# Patient Record
Sex: Female | Born: 1960 | Race: White | Hispanic: No
Health system: Midwestern US, Community
[De-identification: ages and names within clinical notes are randomized; demographics above are authoritative.]

## PROBLEM LIST (undated history)

## (undated) DIAGNOSIS — D6859 Other primary thrombophilia: Secondary | ICD-10-CM

## (undated) DIAGNOSIS — Z86718 Personal history of other venous thrombosis and embolism: Secondary | ICD-10-CM

## (undated) DIAGNOSIS — I519 Heart disease, unspecified: Secondary | ICD-10-CM

## (undated) DIAGNOSIS — I10 Essential (primary) hypertension: Secondary | ICD-10-CM

## (undated) DIAGNOSIS — I219 Acute myocardial infarction, unspecified: Secondary | ICD-10-CM

## (undated) DIAGNOSIS — E7800 Pure hypercholesterolemia, unspecified: Secondary | ICD-10-CM

## (undated) DIAGNOSIS — I639 Cerebral infarction, unspecified: Secondary | ICD-10-CM

## (undated) DIAGNOSIS — N289 Disorder of kidney and ureter, unspecified: Secondary | ICD-10-CM

## (undated) HISTORY — PX: LIVER BIOPSY: SHX301

## (undated) HISTORY — PX: APPENDECTOMY: SHX54

## (undated) HISTORY — DX: Disorder of kidney and ureter, unspecified: N28.9

## (undated) HISTORY — DX: Pure hypercholesterolemia, unspecified: E78.00

## (undated) HISTORY — DX: Acute myocardial infarction, unspecified: I21.9

---

## 2008-09-20 LAB — LIPID PANEL
CHOL/HDL Ratio: 5.6 — ABNORMAL HIGH (ref 0–5.0)
Cholesterol, total: 192 MG/DL (ref 0–200)
HDL Cholesterol: 34 MG/DL — ABNORMAL LOW (ref 40–60)
LDL, calculated: 131.4 MG/DL — ABNORMAL HIGH (ref 0–100)
LDL/HDL Ratio: 3.9
Triglyceride: 133 MG/DL (ref 0–150)
VLDL, calculated: 26.6 MG/DL

## 2008-09-20 LAB — METABOLIC PANEL, COMPREHENSIVE
A-G Ratio: 1.3 (ref 0.8–1.7)
ALT (SGPT): 31 U/L (ref 30–65)
AST (SGOT): 19 U/L (ref 15–37)
Albumin: 4 g/dL (ref 3.4–5.0)
Alk. phosphatase: 137 U/L — ABNORMAL HIGH (ref 50–136)
Anion gap: 9 mmol/L (ref 5–15)
BUN/Creatinine ratio: 7 — ABNORMAL LOW (ref 12–20)
BUN: 9 MG/DL (ref 7–18)
Bilirubin, total: 0.4 MG/DL (ref 0.1–0.9)
CO2: 30 MMOL/L (ref 21–32)
Calcium: 9 MG/DL (ref 8.4–10.4)
Chloride: 100 MMOL/L (ref 100–108)
Creatinine: 1.3 MG/DL (ref 0.6–1.3)
GFR est AA: 56 mL/min/{1.73_m2} — ABNORMAL LOW (ref >60)
GFR est non-AA: 46 mL/min/{1.73_m2} — ABNORMAL LOW (ref >60)
Globulin: 3.2 g/dL (ref 2.0–4.0)
Glucose: 78 MG/DL (ref 74–99)
Potassium: 4 MMOL/L (ref 3.5–5.5)
Protein, total: 7.2 g/dL (ref 6.4–8.2)
Sodium: 139 MMOL/L (ref 136–145)

## 2008-09-20 LAB — CBC WITH AUTOMATED DIFF
ABS. EOSINOPHILS: 0.1 10*3/uL (ref 0.0–0.4)
ABS. LYMPHOCYTES: 0.6 10*3/uL — ABNORMAL LOW (ref 0.8–3.5)
ABS. MONOCYTES: 0.5 10*3/uL (ref 0–1.0)
ABS. NEUTROPHILS: 4.6 10*3/uL (ref 1.8–8.0)
BASOPHILS: 1 % (ref 0–3)
EOSINOPHILS: 2 % (ref 0–5)
HCT: 45.6 % (ref 36.0–46.0)
HGB: 15 g/dL (ref 12.0–16.0)
LYMPHOCYTES: 10 % — ABNORMAL LOW (ref 20–51)
MCH: 31.8 PG (ref 25.0–35.0)
MCHC: 32.9 g/dL (ref 31.0–37.0)
MCV: 96.4 FL (ref 78.0–102.0)
MONOCYTES: 8 % (ref 2–9)
MPV: 7.2 FL — ABNORMAL LOW (ref 7.4–10.4)
NEUTROPHILS: 79 % — ABNORMAL HIGH (ref 42–75)
PLATELET: 337 10*3/uL (ref 130–400)
RBC: 4.73 M/uL (ref 4.10–5.10)
RDW: 17.2 % — ABNORMAL HIGH (ref 11.5–14.5)
WBC: 5.9 10*3/uL (ref 4.5–13.0)

## 2008-09-20 LAB — PROTHROMBIN TIME + INR
INR: 3.4 — ABNORMAL HIGH (ref 0.0–1.2)
Prothrombin time: 36.1 SECS — ABNORMAL HIGH (ref 11.5–15.2)

## 2008-10-25 LAB — METABOLIC PANEL, COMPREHENSIVE
A-G Ratio: 1.2 (ref 0.8–1.7)
ALT (SGPT): 34 U/L (ref 30–65)
AST (SGOT): 21 U/L (ref 15–37)
Albumin: 4 g/dL (ref 3.4–5.0)
Alk. phosphatase: 142 U/L — ABNORMAL HIGH (ref 50–136)
Anion gap: 7 mmol/L (ref 5–15)
BUN/Creatinine ratio: 6 — ABNORMAL LOW (ref 12–20)
BUN: 7 MG/DL (ref 7–18)
Bilirubin, total: 0.7 MG/DL (ref 0.1–0.9)
CO2: 30 MMOL/L (ref 21–32)
Calcium: 9 MG/DL (ref 8.4–10.4)
Chloride: 100 MMOL/L (ref 100–108)
Creatinine: 1.2 MG/DL (ref 0.6–1.3)
GFR est AA: >60 mL/min/{1.73_m2} (ref >60)
GFR est non-AA: 51 mL/min/{1.73_m2} — ABNORMAL LOW (ref >60)
Globulin: 3.3 g/dL (ref 2.0–4.0)
Glucose: 86 MG/DL (ref 74–99)
Potassium: 4.4 MMOL/L (ref 3.5–5.5)
Protein, total: 7.3 g/dL (ref 6.4–8.2)
Sodium: 137 MMOL/L (ref 136–145)

## 2008-10-25 LAB — LIPID PANEL
CHOL/HDL Ratio: 5.8 — ABNORMAL HIGH (ref 0–5.0)
Cholesterol, total: 196 MG/DL (ref 0–200)
HDL Cholesterol: 34 MG/DL — ABNORMAL LOW (ref 40–60)
LDL, calculated: 131.6 MG/DL — ABNORMAL HIGH (ref 0–100)
LDL/HDL Ratio: 3.9
Triglyceride: 152 MG/DL — ABNORMAL HIGH (ref 0–150)
VLDL, calculated: 30.4 MG/DL

## 2008-10-25 LAB — CBC WITH AUTOMATED DIFF
ABS. EOSINOPHILS: 0.2 10*3/uL (ref 0.0–0.4)
ABS. LYMPHOCYTES: 1.6 10*3/uL (ref 0.8–3.5)
ABS. MONOCYTES: 0.4 10*3/uL (ref 0–1.0)
ABS. NEUTROPHILS: 7.3 10*3/uL (ref 1.8–8.0)
BASOPHILS: 0 % (ref 0–3)
EOSINOPHILS: 2 % (ref 0–5)
HCT: 43.4 % (ref 36.0–46.0)
HGB: 14.9 g/dL (ref 12.0–16.0)
LYMPHOCYTES: 16 % — ABNORMAL LOW (ref 20–51)
MCH: 32.7 PG (ref 25.0–35.0)
MCHC: 34.4 g/dL (ref 31.0–37.0)
MCV: 95.2 FL (ref 78.0–102.0)
MONOCYTES: 5 % (ref 2–9)
MPV: 7.6 FL (ref 7.4–10.4)
NEUTROPHILS: 77 % — ABNORMAL HIGH (ref 42–75)
PLATELET: 371 10*3/uL (ref 130–400)
RBC: 4.56 M/uL (ref 4.10–5.10)
RDW: 16.5 % — ABNORMAL HIGH (ref 11.5–14.5)
WBC: 9.6 10*3/uL (ref 4.5–13.0)

## 2008-10-25 LAB — PROTHROMBIN TIME + INR
INR: 2.2 — ABNORMAL HIGH (ref 0.0–1.2)
Prothrombin time: 24.8 SECS — ABNORMAL HIGH (ref 11.5–15.2)

## 2008-12-07 LAB — CBC WITH AUTOMATED DIFF
ABS. LYMPHOCYTES: 1.3 10*3/uL (ref 0.8–3.5)
ABS. MONOCYTES: 0.4 10*3/uL (ref 0–1.0)
ABS. NEUTROPHILS: 6.2 10*3/uL (ref 1.8–8.0)
BASOPHILS: 1 % (ref 0–3)
EOSINOPHILS: 2 % (ref 0–5)
HCT: 46.2 % — ABNORMAL HIGH (ref 36.0–46.0)
HGB: 15.1 g/dL (ref 12.0–16.0)
LYMPHOCYTES: 16 % — ABNORMAL LOW (ref 20–51)
MCH: 32 PG (ref 25.0–35.0)
MCHC: 32.6 g/dL (ref 31.0–37.0)
MCV: 98 FL (ref 78.0–102.0)
MONOCYTES: 5 % (ref 2–9)
MPV: 7.3 FL — ABNORMAL LOW (ref 7.4–10.4)
NEUTROPHILS: 76 % — ABNORMAL HIGH (ref 42–75)
PLATELET: 326 10*3/uL (ref 130–400)
RBC: 4.72 M/uL (ref 4.10–5.10)
RDW: 15.9 % — ABNORMAL HIGH (ref 11.5–14.5)
WBC: 8.1 10*3/uL (ref 4.5–13.0)

## 2008-12-07 LAB — METABOLIC PANEL, COMPREHENSIVE
A-G Ratio: 1.3 (ref 0.8–1.7)
ALT (SGPT): 37 U/L (ref 30–65)
AST (SGOT): 18 U/L (ref 15–37)
Albumin: 3.9 g/dL (ref 3.4–5.0)
Alk. phosphatase: 132 U/L (ref 50–136)
Anion gap: 6 mmol/L (ref 5–15)
BUN/Creatinine ratio: 11 — ABNORMAL LOW (ref 12–20)
BUN: 12 MG/DL (ref 7–18)
Bilirubin, total: 0.4 MG/DL (ref 0.1–0.9)
CO2: 30 MMOL/L (ref 21–32)
Calcium: 8.7 MG/DL (ref 8.4–10.4)
Chloride: 102 MMOL/L (ref 100–108)
Creatinine: 1.1 MG/DL (ref 0.6–1.3)
GFR est AA: >60 mL/min/{1.73_m2} (ref >60)
GFR est non-AA: 56 mL/min/{1.73_m2} — ABNORMAL LOW (ref >60)
Globulin: 3.1 g/dL (ref 2.0–4.0)
Glucose: 55 MG/DL — ABNORMAL LOW (ref 74–99)
Potassium: 4.3 MMOL/L (ref 3.5–5.5)
Protein, total: 7 g/dL (ref 6.4–8.2)
Sodium: 138 MMOL/L (ref 136–145)

## 2008-12-07 LAB — LIPID PANEL
CHOL/HDL Ratio: 4.9 (ref 0–5.0)
Cholesterol, total: 163 MG/DL (ref 0–200)
HDL Cholesterol: 33 MG/DL — ABNORMAL LOW (ref 40–60)
LDL, calculated: 94.4 MG/DL (ref 0–100)
LDL/HDL Ratio: 2.9
Triglyceride: 178 MG/DL — ABNORMAL HIGH (ref 0–150)
VLDL, calculated: 35.6 MG/DL

## 2008-12-07 LAB — PROTHROMBIN TIME + INR
INR: 2.1 — ABNORMAL HIGH (ref 0.0–1.2)
Prothrombin time: 24.3 SECS — ABNORMAL HIGH (ref 11.5–15.2)

## 2009-03-28 LAB — PROTHROMBIN TIME + INR
INR: 2.6 — ABNORMAL HIGH (ref 0.0–1.2)
Prothrombin time: 28.8 SECS — ABNORMAL HIGH (ref 11.5–15.2)

## 2009-06-28 LAB — PROTHROMBIN TIME + INR
INR: 2.3 — ABNORMAL HIGH (ref 0.0–1.2)
Prothrombin time: 25.5 SECS — ABNORMAL HIGH (ref 11.5–15.2)

## 2009-09-24 LAB — METABOLIC PANEL, BASIC
Anion gap: 13 mmol/L (ref 5–15)
BUN/Creatinine ratio: 10 — ABNORMAL LOW (ref 12–20)
BUN: 10 MG/DL (ref 7–18)
CO2: 21 MMOL/L (ref 21–32)
Calcium: 8.1 MG/DL — ABNORMAL LOW (ref 8.4–10.4)
Chloride: 102 MMOL/L (ref 100–108)
Creatinine: 1 MG/DL (ref 0.6–1.3)
GFR est AA: >60 mL/min/{1.73_m2} (ref >60)
GFR est non-AA: >60 mL/min/{1.73_m2} (ref >60)
Glucose: 130 MG/DL — ABNORMAL HIGH (ref 74–99)
Potassium: 3.6 MMOL/L (ref 3.5–5.5)
Sodium: 136 MMOL/L (ref 136–145)

## 2009-09-24 LAB — CBC WITH AUTOMATED DIFF
ABS. BASOPHILS: 0.1 10*3/uL (ref 0.0–0.1)
ABS. EOSINOPHILS: 0.2 10*3/uL (ref 0.0–0.4)
ABS. LYMPHOCYTES: 1.6 10*3/uL (ref 0.8–3.5)
ABS. MONOCYTES: 0.4 10*3/uL (ref 0–1.0)
ABS. NEUTROPHILS: 9.5 10*3/uL — ABNORMAL HIGH (ref 1.8–8.0)
BASOPHILS: 1 % (ref 0–3)
EOSINOPHILS: 2 % (ref 0–5)
HCT: 40.5 % (ref 36.0–46.0)
HGB: 13.5 g/dL (ref 12.0–16.0)
LYMPHOCYTES: 14 % — ABNORMAL LOW (ref 20–51)
MCH: 33 PG (ref 25.0–35.0)
MCHC: 33.4 g/dL (ref 31.0–37.0)
MCV: 98.9 FL (ref 78.0–102.0)
MONOCYTES: 3 % (ref 2–9)
MPV: 6.9 FL — ABNORMAL LOW (ref 7.4–10.4)
NEUTROPHILS: 80 % — ABNORMAL HIGH (ref 42–75)
PLATELET: 304 10*3/uL (ref 130–400)
RBC: 4.1 M/uL (ref 4.10–5.10)
RDW: 16.2 % — ABNORMAL HIGH (ref 11.5–14.5)
WBC: 11.8 10*3/uL (ref 4.5–13.0)

## 2009-09-24 NOTE — H&P (Unsigned)
[facility]   [address] [place], [place] [address]     HISTORY AND PHYSICAL REPORT    PATIENT: [name], [name]  BILLING: [id number] ADMITTED: 09/24/2009  MRN: [id number] LOCATION: [id number]  ATTENDING: Llana Aliment, DO  DICTATING: [name], MD      CHIEF COMPLAINT: Acute anterior myocardial infarction.    HISTORY OF PRESENT ILLNESS: The patient is a 48 year old, white female  without prior recognized history of cardiac disease. At about 4 o'clock  this evening, she developed unrelenting retrosternal chest pain, called the  rescue squad. Transmitted EKG via rescue squad showed anterior ST-segment  elevation. She has risk factors for coronary disease which include a  history of smoking about a pack and half cigarettes daily, and positive  family history for premature coronary artery disease. She is not aware of a  history of hyperlipidemia, hypertension, or diabetes mellitus.    MEDICATIONS CURRENTLY: Include Coumadin typically 2.5 mg in the morning.    ALLERGIES: She thinks she has an allergy to a medication, but does not  remember what that medication is.    PAST MEDICAL HISTORY: Remarkable for having had a stroke in the early  1990s. She apparently was in a coma for about 4 days, has speech problems,  has right-sided weakness, though that has improved to some degree over  time. She is disabled secondary to that stroke. She has also had  thrombophlebitis of her legs which is the indication for her chronic  Coumadin therapy. Last episode of deep vein thrombosis was apparently 4-5  years ago. She is not aware of a history of GI bleeding, no recent stroke,  no typical claudication but she has limited activity level. No history of  weight loss or malignancies. She has had some episodes of chest discomfort  in the recent past which is attributed to indigestion. These have not  clearly been exertional.    SOCIAL HISTORY: She is single, smoker and disabled.     FAMILY HISTORY: Strongly positive for premature coronary artery disease.    PHYSICAL EXAMINATION  GENERAL APPEARANCE: Reveals a middle-aged, white female who has trouble  giving an accurate history, but is oriented and reasonably appropriate.  VITAL SIGNS: Her blood pressure is 95/70. Her heart rate is about 75 and  it is regular.  HEENT: Her sclerae are nonicteric.  NECK: I do not hear carotid bruits.  LUNGS: Clear. I did not hear murmurs or gallops.  ABDOMEN: Nontender.  EXTREMITIES: She has palpable femoral, absent left pedal, 1 to 2+ right  posterior tibial pulses. She does not have significant peripheral edema,  currently does have ecchymoses of her legs.    ANCILLARY DATA: Electrocardiogram shows normal sinus rhythm with anterior  ST-segment elevation and reciprocal inferior ST-segment depression.    IMPRESSION: The patient is having an acute anterior myocardial infarction.  She is on Coumadin. We will check an INR, but we are recommending going  urgently to the cath lab for potential percutaneous intervention. We have  gone over with her the benefits and risks, and she is willing to proceed.                         Preliminary   [name], MD        DL:wmx  D: 96/29/5284 1:32 P T: 09/24/2009 8:26 P  Job #: [id number] CScriptDoc #: [id number]  cc: Llana Aliment, DO   [name], MD

## 2009-09-25 LAB — LIPID PANEL
CHOL/HDL Ratio: 4.8 (ref 0–5.0)
Cholesterol, total: 152 MG/DL (ref 0–200)
HDL Cholesterol: 32 MG/DL — ABNORMAL LOW (ref 40–60)
LDL, calculated: 94.4 MG/DL (ref 0–100)
Triglyceride: 128 MG/DL (ref 0–150)
VLDL, calculated: 25.6 MG/DL

## 2009-09-25 LAB — POC ACTIVATED CLOTTING TIME
Activated Clotting Time (POC): 146 SECS — ABNORMAL HIGH (ref 79–138)
Activated Clotting Time (POC): 180 SECS — ABNORMAL HIGH (ref 79–138)
Activated Clotting Time (POC): 185 SECS — ABNORMAL HIGH (ref 79–138)
Activated Clotting Time (POC): 185 SECS — ABNORMAL HIGH (ref 79–138)
Activated Clotting Time (POC): 189 SECS — ABNORMAL HIGH (ref 79–138)
Activated Clotting Time (POC): 194 SECS — ABNORMAL HIGH (ref 79–138)
Activated Clotting Time (POC): 325 SECS — ABNORMAL HIGH (ref 79–138)

## 2009-09-25 LAB — METABOLIC PANEL, BASIC
Anion gap: 12 mmol/L (ref 5–15)
BUN/Creatinine ratio: 7 — ABNORMAL LOW (ref 12–20)
BUN: 8 MG/DL (ref 7–18)
CO2: 22 MMOL/L (ref 21–32)
Calcium: 8.3 MG/DL — ABNORMAL LOW (ref 8.4–10.4)
Chloride: 103 MMOL/L (ref 100–108)
Creatinine: 1.1 MG/DL (ref 0.6–1.3)
GFR est AA: >60 mL/min/{1.73_m2} (ref >60)
GFR est non-AA: 56 mL/min/{1.73_m2} — ABNORMAL LOW (ref >60)
Glucose: 94 MG/DL (ref 74–99)
Potassium: 4.1 MMOL/L (ref 3.5–5.5)
Sodium: 137 MMOL/L (ref 136–145)

## 2009-09-25 LAB — CPK-MB PANEL
CK - MB: 1.2 ng/ml (ref 0.5–3.6)
CK - MB: 62.4 ng/ml — ABNORMAL HIGH (ref 0.5–3.6)
CK-MB Index: 2.9 % (ref 0.0–4.0)
CK-MB Index: 8.7 % — ABNORMAL HIGH (ref 0.0–4.0)
CK: 42 U/L (ref 21–215)
CK: 720 U/L — ABNORMAL HIGH (ref 21–215)
Troponin-I, QT: 31.48 NG/ML — CR (ref 0.00–0.08)
Troponin-I, QT: <0.04 NG/ML (ref 0.00–0.08)

## 2009-09-25 LAB — PROTHROMBIN TIME + INR
INR: 1.6 — ABNORMAL HIGH (ref 0.0–1.2)
INR: 1.8 — ABNORMAL HIGH (ref 0.0–1.2)
Prothrombin time: 19.1 SECS — ABNORMAL HIGH (ref 11.5–15.2)
Prothrombin time: 20.5 SECS — ABNORMAL HIGH (ref 11.5–15.2)

## 2009-09-25 LAB — PTT: aPTT: >180 s — CR (ref 24.6–37.7)

## 2009-09-25 NOTE — Procedures (Unsigned)
CARDIAC CATHETERIZATION   [facility] CARDIOVASCULAR   SERVICES   [facility]   [address]   [place], [place] [address]  CARDIAC  CATHETE  RIZATIO   N      PATIENT [name] [name], [place]en[place]ard STUDY DATE: 09/24/2009  NAME:  DOB: 1961/11/15 ([birth year] SSN #: [id number]  BILLING #: [id number] ROOM: [id number]  REFERRING: [name], MD  INTERPRETING [name], MD  :      CARDIAC CATHETERIZATION-CORONARY INTERVENTION    PATIENT PROFILE AND INDICATIONS: The patient is a 48 year old, white  female, history of prior CVA, history of chronic Coumadin therapy for deep  vein thrombosis with risk factors for coronary disease who presents with an  acute anterior myocardial infarction with anterior ST-segment elevation,  reciprocal inferior ST-segment depression, and ongoing chest pain  symptomatology.    PROCEDURE: The patient was taken urgently to the cardiac catheterization  laboratory. We performed a selective coronary angiography and percutaneous  coronary intervention of the totally occluded proximal LAD from the left  femoral approach after inability to cannulate the right femoral artery. We  used 5-French catheters through a 6 arterial sheath for diagnostic studies,  and then performed percutaneous coronary intervention of the totally  occluded proximal left anterior descending, using a 6-4 left Judkins  guiding catheter, BMW guidewire, initial balloon dilatations with 3.0 x 15  mm length Voyager balloon, and then deployed a 3.0 x 16 mm length Vision  stent dilated to a maximum of 14 atmospheres. There later appeared to be  disruption distal to the stent, though the initial pictures did not show  this and this was covered with an overlapping 3.0 x 12 mm Vision stent to  12 atmospheres. There appeared to be thrombus present within the stented  segment, and this was dealt with with continued anti-platelet therapy.  Patient was given Plavix and Integrilin. We then did a high-pressure   balloon dilatation with a 3.25 x 15 mm length noncompliant balloon to a  maximum of 20 atmospheres, and repeat angiograms were performed.    RESULTS: Left ventricular end-diastolic pressure is mildly elevated at 20.  There is no aortic valve gradient.    Left ventriculography: There is akinesis of the anterior apical walls and  ejection fraction in the RAO projection is reduced, estimated at 30%. No  mitral regurgitation is noted.    Coronary angiography: The right coronary artery is dominant and is  angiographically normal. The left main and circumflex are also normal. The  left anterior descending is totally occluded in the proximal portion. After  final stenting and high-pressure dilatations, the total occlusion was  reduced to a 0% narrowing with TIMI-3 flow distally. No evident intramural  thrombus.    ASSESSMENT  1. Single-vessel coronary artery disease with total occlusion of the left  anterior descending, presenting as an ST-elevation myocardial infarction.  2. Successful stenting of the totally occluded left anterior descending  with a bare metal stent due to the need for chronic Coumadin  anticoagulation.    At the current time, we will be committed to at least a month of  anti-platelet therapy with aspirin, Plavix, and probably would continue  Coumadin during that time frame and cover with Protonix or Nexium. She will  likely require medical therapy for left ventricular systolic dysfunction.  Will require smoking cessation. Likely will require lipid therapy.  Preliminary   [name], MD    DL:wmx  D: 16/08/9603 5:40 P T: 09/25/2009  7:12 A  CQDocID #: [id number] CScriptDoc #:  [id number]   cc: [name], MD    DR. [name]

## 2009-09-26 LAB — PROTHROMBIN TIME + INR
INR: 1.7 — ABNORMAL HIGH (ref 0.0–1.2)
Prothrombin time: 19.8 SECS — ABNORMAL HIGH (ref 11.5–15.2)

## 2009-09-27 LAB — PROTHROMBIN TIME + INR
INR: 2 — ABNORMAL HIGH (ref 0.0–1.2)
Prothrombin time: 22.2 SECS — ABNORMAL HIGH (ref 11.5–15.2)

## 2009-09-27 NOTE — Procedures (Unsigned)
[facility] VASCULAR   LABORATORY SERVICES   [address]   [place], [place] [address]   VASCULAR LABORATORY REPORT   INPATIENT    PATIENT NAME: [name] [name], [name] [name] STUDY DATE:  MRN: [id number] LOCATION: [id number]  BILLING #: [id number]  REFERRING:  INTERPRETING: Ivin Poot, MD    STUDY DATE: 09/25/2009    STUDY PERFORMED: PERIPHERAL ARTERIAL TESTING (LOWER)    INDICATIONS: Risk Factors Tobacco: Severe      INTERPRETATION:  1. At rest bilateral lower extremities are triphasic/biphasic throughout.  2. No evidence of significant peripheral arterial disease at rest in the  right leg.  3. The right ankle/brachial index is 1.14 and the left ankle/brachial index  is 1.13.  Normal lower extremity arterial examination at rest bilaterally. Exam  performed at bedside or ICU.       Electronically Signed: Wynona Dove, M.D, FACS, RVT   09/25/2009 06:00 PM        RJD:sw  D: T: 09/27/2009 10:36 A  CQDocID #: CScriptDoc #: [id number]  cc: Ivin Poot, MD   [name], MD

## 2009-09-27 NOTE — Discharge Summary (Unsigned)
[facility]   [address] [place] [address]     DISCHARGE SUMMARY    PATIENT: [name] [name], [name] [name]  MRN: [id number] ADMITTED: 09/24/2009  BILLING: [id number] DISCHARGED: 09/27/2009  ATTENDING: [name], MD  DICTATING: [name], MD        DISCHARGE DIAGNOSES  1. Anterior myocardial infarction, limited, with bare metal stenting of the  totally occluded left anterior descending.  2. Smoker.  3. Mild hyperlipidemia.  4. Status post prior cerebrovascular accident.  5. History of deep vein thrombosis on chronic Coumadin therapy.    PROCEDURES IN HOSPITAL: Cardiac catheterization with bare metal stenting  of the totally occluded left anterior descending coronary artery.    HISTORY AND HOSPITAL COURSE: The patient is a pleasant 48 year old white  female who has had multiple medical problems. She had a stroke with  right-sided weakness, speech difficulty about 17 years ago. Has had deep  vein thrombosis and is on chronic Coumadin therapy. She has not had a known  history of cardiac disease, though does smoke a pack and half of cigarettes  daily. She presented to [facility] emergency room with prolonged  severe chest pain with anterior ST-segment elevation. She was taken  urgently to the cardiac catheterization laboratory, and there was noted to  have total occlusion of the proximal left anterior descending coronary  artery with no significant circumflex left main or right coronary artery  disease. This was treated with bare metal stenting of the left anterior  descending coronary artery with deployment of overlapping 3.0 x 16 and 3.0  x 12 Vision stents which were post pulse dilated with a 3.25 noncompliant  balloon to 20 atmospheres. She had an excellent angiographic result with  restoration of TIMI-3 flow distally and resolution of her chest pain  symptomatology. Left ventriculography at that point suggested depression of   her left ventricular ejection fraction to about 30% with anterior apical  akinesis, mildly elevated left ventricular end-diastolic pressure of 20.  She had an uncomplicated post interventional course without significant  arrhythmia, without recurrent chest pain symptomatology, and without  congestive heart failure. She tends to run a low blood pressure which  limited our ability to treat her left ventricular dysfunction, and she is  only able to be discharged on very low-dose of Toprol. She, however, again  did not have overt congestive heart failure. She has not smoked since she  has been in the hospital. She has a therapeutic INR of 2.0. Her  electrocardiogram did show evolutionary changes of an anterior myocardial  infarction and her peak troponin was 31.48 with a CK-MB of 62.4. LDL was  94.4.    At the current time we are planning discharge on medical therapy. She will  be continued on Coumadin at 2.5 mg daily. Will follow up INR's with Dr.  [name]. We will keep her on Plavix 75 mg daily, aspirin 81 mg daily for at  least a month, and would anticipate discontinuation of Plavix at about that  time frame, but continuation of low-dose aspirin. She was placed on  Toprol-XL 12.5 mg daily and simvastatin 40 mg in the evening. We have asked  her to check back with [place] in a week or 2, and would anticipate potentially  increasing beta blocker dosage or addition of ACE inhibitor if her blood  pressure would appear to tolerate that. She will require repeat assessment  of left ventricular systolic function probably 6 to 10 weeks down the road  to see how much of her apparent left ventricular dysfunction is related to  stunning versus necrosis. We have instructed her of the importance of  smoking cessation. I have gone over dietary and activity instructions.                           Preliminary   [name], MD    DL:wmx  D: 16/08/9603 5:40 A T: 09/27/2009 8:25 A  Job #: [id number] CScriptDoc #: [id number]   cc: Venda Rodes, MD

## 2014-07-19 LAB — URINALYSIS W/ RFLX MICROSCOPIC
Bilirubin: NEGATIVE
Glucose: 100 mg/dL — AB
Leukocyte Esterase: NEGATIVE
Nitrites: POSITIVE — AB
Protein: >300 mg/dL — AB
Specific gravity: >1.03 — ABNORMAL HIGH (ref 1.003–1.030)
Urobilinogen: 2 EU/dL — ABNORMAL HIGH (ref 0.2–1.0)
pH (UA): 6.5 (ref 5.0–8.0)

## 2014-07-19 LAB — URINE MICROSCOPIC ONLY: WBC: 5 /hpf (ref 0–4)

## 2014-07-19 LAB — HCG URINE, QL: HCG urine, QL: NEGATIVE

## 2014-07-19 MED ORDER — NITROFURANTOIN (25% MACROCRYSTAL FORM) 100 MG CAP
100 mg | ORAL_CAPSULE | Freq: Two times a day (BID) | ORAL | Status: AC
Start: 2014-07-19 — End: 2014-07-26

## 2014-07-19 MED ORDER — NITROFURANTOIN (25% MACROCRYSTAL FORM) 100 MG CAP
100 mg | ORAL | Status: AC
Start: 2014-07-19 — End: 2014-07-19
  Administered 2014-07-19: 22:00:00 via ORAL

## 2014-07-19 MED FILL — NITROFURANTOIN (25% MACROCRYSTAL FORM) 100 MG CAP: 100 mg | ORAL | Qty: 1

## 2014-07-19 NOTE — ED Provider Notes (Signed)
HPI Comments: Well-appearing, with noted PMH, here reporting several days of lower abdominal "pressure" with urinary frequency today with blood in urine.  Pt denies associated fever, chills, CP, SOB, abdominal or back pain, n/v, diarrhea, unusual vaginal discharge or extremity weakness,numbness or tingling.  Pt states she has annual pelvic exams with this years pap reported normal.  Pt reports compliance with Coumadin and last INR check "was fine".  Next appt with Dr. Althea Grimmer is in 6 days.  Pt denies injury.  No other concerns.     Patient is a 53 y.o. female presenting with frequency and hematuria. The history is provided by the patient.   Urinary Frequency   Associated symptoms include frequency and hematuria.   Blood in Urine   Associated symptoms include frequency and hematuria.        Past Medical History   Diagnosis Date   ??? Hypertension    ??? Hyperlipemia    ??? Thromboembolus (HCC)    ??? Stroke [facility])      1991- right side affected        History reviewed. No pertinent past surgical history.      History reviewed. No pertinent family history.     History     Social History   ??? Marital Status: DIVORCED     Spouse Name: N/A     Number of Children: N/A   ??? Years of Education: N/A     Occupational History   ??? Not on file.     Social History Main Topics   ??? Smoking status: Light Tobacco Smoker   ??? Smokeless tobacco: Not on file   ??? Alcohol Use: Yes   ??? Drug Use: No   ??? Sexual Activity: Not on file     Other Topics Concern   ??? Not on file     Social History Narrative   ??? No narrative on file                  ALLERGIES: Review of patient's allergies indicates not on file.      Review of Systems   Genitourinary: Positive for frequency and hematuria.   All other systems reviewed and are negative.      Filed Vitals:    07/19/14 1613 07/19/14 1820   BP: 131/82 127/80   Pulse: 83 79   Temp: 98.5 ??F (36.9 ??C)    Resp: 18 18   Height:  (1.727 m)    Weight: 72.576 kg (160 lb)    SpO2: 97% 98%            Physical Exam    Constitutional: She is oriented to person, place, and time. She appears well-developed and well-nourished. No distress.   HENT:   Head: Normocephalic and atraumatic.   Eyes: No scleral icterus.   Neck: Normal range of motion. Neck supple.   Non-tender to midline palpation   Cardiovascular: Normal rate, regular rhythm and normal heart sounds.  Exam reveals no gallop and no friction rub.    No murmur heard.  Pulmonary/Chest: Effort normal and breath sounds normal. She has no wheezes. She has no rales.   Abdominal: Soft. Bowel sounds are normal. She exhibits no distension. There is tenderness. There is no rebound and no guarding.   Mild suprapubic tenderness.  No peritoneal signs   Genitourinary:   No flank or CVA tenderness   Musculoskeletal: Normal range of motion.   Ambulatory   Neurological: She is alert and oriented to person,  place, and time. She has normal strength. No cranial nerve deficit (No apparent deficits).   Skin: Skin is warm and dry. She is not diaphoretic.   Psychiatric: She has a normal mood and affect. Her behavior is normal.   Nursing note and vitals reviewed.       MDM  Number of Diagnoses or Management Options  Acute cystitis with hematuria:   Diagnosis management comments: C/o urinary freq and lower abdominal pressure x 2 days, today with blood in urine.  Discussed with pt that blood could be from vagina.  Pt states she hasn't been sexually active in over 10 years, and refuses pelvic and any blood work.  Pt agrees to urinalysis, stating she will follow up as usual every month with PCP.  Pt does look well, and HPI and physical exam are more consistent with UTI.         Amount and/or Complexity of Data Reviewed  Clinical lab tests: ordered and reviewed  Discussion of test results with the performing providers: yes (  Discussed with Dr. Sherian Maroon that pt did not want blood work to be drawn per his orders in triage.  Reviewed the importance of follow up and return to  ER especially if no improvement with ABX or worsening symptoms. )    Risk of Complications, Morbidity, and/or Mortality  Presenting problems: low  Diagnostic procedures: low  Management options: low    Patient Progress  Patient progress: stable    Diagnosis:   1. Acute cystitis with hematuria          Disposition: home    Follow-up Information     Follow up With Details Comments Contact Info    Brayton Caves, MD  as scheduled without fail for recheck of today's concerns and monthly check up [address]  [place] [address]  [phone]      [facility] EMERGENCY DEPT  If symptoms worsen or you develop fever, abdominal pain, back pain or other concerns.  [address]  [place] [address]  [phone]          Discharge Medication List as of 07/19/2014  6:16 PM      START taking these medications    Details   !! nitrofurantoin, macrocrystal-monohydrate, (MACROBID) 100 mg capsule Take 1 Cap by mouth two (2) times a day for 7 days., Print, Disp-14 Cap, R-0      !! nitrofurantoin, macrocrystal-monohydrate, (MACROBID) 100 mg capsule Take 1 Cap by mouth two (2) times a day for 7 days. First dose given in ER today, Print, Disp-13 Cap, R-0       !! - Potential duplicate medications found. Please discuss with provider.      CONTINUE these medications which have NOT CHANGED    Details   warfarin (COUMADIN) 1 mg tablet Take 1 mg by mouth daily., Historical Med                 Procedures

## 2014-07-19 NOTE — ED Notes (Signed)
I have reviewed discharge instructions with the patient.  The patient verbalized understanding. Patient armband removed and shredded.

## 2014-07-23 LAB — CULTURE, URINE
Culture result:: NO GROWTH
Culture: NO GROWTH

## 2014-11-23 ENCOUNTER — Encounter

## 2014-11-23 ENCOUNTER — Inpatient Hospital Stay: Admit: 2014-11-23 | Payer: MEDICARE | Primary: Internal Medicine

## 2014-11-23 DIAGNOSIS — D6859 Other primary thrombophilia: Secondary | ICD-10-CM

## 2014-11-23 LAB — PROTHROMBIN TIME + INR
INR: 2.7 — ABNORMAL HIGH (ref 0.8–1.2)
Prothrombin time: 28.9 s — ABNORMAL HIGH (ref 11.5–15.2)

## 2015-01-02 ENCOUNTER — Encounter

## 2015-01-02 ENCOUNTER — Inpatient Hospital Stay: Admit: 2015-01-02 | Payer: MEDICARE | Primary: Internal Medicine

## 2015-01-02 DIAGNOSIS — D6859 Other primary thrombophilia: Secondary | ICD-10-CM

## 2015-01-02 LAB — PROTHROMBIN TIME + INR
INR: 2.3 — ABNORMAL HIGH (ref 0.8–1.2)
Prothrombin time: 24.9 s — ABNORMAL HIGH (ref 11.5–15.2)

## 2018-02-02 ENCOUNTER — Ambulatory Visit
Admission: EM | Admit: 2018-02-02 | Discharge: 2018-02-02 | Disposition: A | Discharge status: Home / Self Care | Payer: Medicare Other | Attending: Family Medicine | Admitting: Family Medicine

## 2018-02-02 ENCOUNTER — Other Ambulatory Visit: Payer: Self-pay

## 2018-02-02 ENCOUNTER — Encounter: Payer: Self-pay | Admitting: Emergency Medicine

## 2018-02-02 DIAGNOSIS — S39012A Strain of muscle, fascia and tendon of lower back, initial encounter: Secondary | ICD-10-CM | POA: Diagnosis not present

## 2018-02-02 HISTORY — DX: Heart disease, unspecified: I51.9

## 2018-02-02 HISTORY — DX: Cerebral infarction, unspecified: I63.9

## 2018-02-02 HISTORY — DX: Essential (primary) hypertension: I10

## 2018-02-02 HISTORY — DX: Personal history of other venous thrombosis and embolism: Z86.718

## 2018-02-02 MED ORDER — CYCLOBENZAPRINE HCL 10 MG PO TABS: 10.0000 mg | ORAL_TABLET | Freq: Three times a day (TID) | ORAL | 0 refills | Status: AC | PRN

## 2018-02-02 MED ORDER — TRAMADOL HCL 50 MG PO TABS: ORAL_TABLET | ORAL | 0 refills | Status: DC

## 2018-02-02 NOTE — ED Triage Notes (Signed)
Patient in today c/o back pain x 1 week. No injury noted. Patient has tried OTC Tylenol for the pain without relief.

## 2018-02-02 NOTE — ED Provider Notes (Signed)
[facility]    CSN: [id number] Arrival date & time: 02/02/18  0905     History   Chief Complaint Chief Complaint  Patient presents with  . Back Pain    HPI Cheryl Baldwin is a 57 y.o. female.   The history is provided by the patient.  Back Pain  Location:  Lumbar spine Quality:  Aching Radiates to:  Does not radiate Pain severity:  Moderate Pain is:  Same all the time Onset quality:  Sudden Duration:  1 week Timing:  Constant Progression:  Unchanged Chronicity:  New Context: lifting heavy objects and twisting   Context: not emotional stress, not falling, not jumping from heights, not MCA, not MVA, not occupational injury, not pedestrian accident, not physical stress, not recent illness and not recent injury   Relieved by:  Nothing Worsened by:  Bending and twisting Ineffective treatments:  OTC medications Associated symptoms: no abdominal pain, no abdominal swelling, no bladder incontinence, no bowel incontinence, no chest pain, no fever, no headaches, no leg pain, no numbness, no paresthesias, no pelvic pain, no perianal numbness, no tingling, no weakness and no weight loss   Risk factors: no hx of cancer, no hx of osteoporosis, no lack of exercise, no menopause, not obese, not pregnant, no recent surgery, no steroid use and no vascular disease     Past Medical History:  Diagnosis Date  . Heart disease    heart attack - no stents  . Hx of blood clots    legs  . Hypertension   . Stroke [facility])     There are no active problems to display for this patient.   History reviewed. No pertinent surgical history.  OB History    No data available       Home Medications    Prior to Admission medications   Medication Sig Start Date End Date Taking? Authorizing Provider  metoprolol succinate (TOPROL-XL) 25 MG 24 hr tablet Take 25 mg by mouth daily.   Yes [provider]  rosuvastatin (CRESTOR) 20 MG tablet Take 20 mg by mouth daily.   Yes [provider]  warfarin (COUMADIN) 1 MG tablet Take 1 mg by mouth daily.   Yes [provider]  warfarin (COUMADIN) 2.5 MG tablet Take 2.5 mg by mouth daily.   Yes [provider]  cyclobenzaprine (FLEXERIL) 10 MG tablet Take 1 tablet (10 mg total) by mouth 3 (three) times daily as needed for muscle spasms. 02/02/18   Norval Gable, MD  traMADol Veatrice Bourbon) 50 MG tablet 1 tab po q 8 hours prn 02/02/18   Norval Gable, MD    Family History Family History  Problem Relation Age of Onset  . Heart attack Father     Social History Social History   Tobacco Use  . Smoking status: Current Every Day Smoker  . Smokeless tobacco: Never Used  Substance Use Topics  . Alcohol use: Yes    Comment: rarely  . Drug use: No     Allergies   Penicillins   Review of Systems Review of Systems  Constitutional: Negative for fever and weight loss.  Cardiovascular: Negative for chest pain.  Gastrointestinal: Negative for abdominal pain and bowel incontinence.  Genitourinary: Negative for bladder incontinence and pelvic pain.  Musculoskeletal: Positive for back pain.  Neurological: Negative for tingling, weakness, numbness, headaches and paresthesias.     Physical Exam Triage Vital Signs ED Triage Vitals  Enc Vitals Group     BP 02/02/18 0936 129/83  Pulse Rate 02/02/18 0936 64     Resp 02/02/18 0936 16     Temp 02/02/18 0936 97.6 F (36.4 C)     Temp Source 02/02/18 0936 Oral     SpO2 02/02/18 0936 100 %     Weight 02/02/18 0937 158 lb (71.7 kg)     Height 02/02/18 0937 5\' 8"  (1.727 m)     Head Circumference --      Peak Flow --      Pain Score 02/02/18 0937 10     Pain Loc --      Pain Edu? --      Excl. in [place]? --    No data found.  Updated Vital Signs BP 129/83 (BP Location: Left Arm)   Pulse 64   Temp 97.6 F (36.4 C) (Oral)   Resp 16   Ht 5\' 8"  (1.727 m)   Wt 158 lb (71.7 kg)   SpO2 100%   BMI 24.02 kg/m   Visual Acuity Right Eye Distance:     Left Eye Distance:   Bilateral Distance:    Right Eye Near:   Left Eye Near:    Bilateral Near:     Physical Exam  Constitutional: She appears well-developed and well-nourished. No distress.  Musculoskeletal: She exhibits tenderness. She exhibits no edema.       Lumbar back: She exhibits tenderness (left lumbar paraspinous muscles) and spasm. She exhibits normal range of motion, no bony tenderness, no swelling, no edema, no deformity, no laceration, no pain and normal pulse.  Neurological: She is alert. She has normal reflexes. She displays normal reflexes. She exhibits normal muscle tone.  Skin: Skin is warm and dry. No rash noted. She is not diaphoretic. No erythema.  Nursing note and vitals reviewed.    UC Treatments / Results  Labs (all labs ordered are listed, but only abnormal results are displayed) Labs Reviewed - No data to display  EKG  EKG Interpretation None       Radiology No results found.  Procedures Procedures (including critical care time)  Medications Ordered in UC Medications - No data to display   Initial Impression / Assessment and Plan / UC Course  I have reviewed the triage vital signs and the nursing notes.  Pertinent labs & imaging results that were available during my care of the patient were reviewed by me and considered in my medical decision making (see chart for details).       Final Clinical Impressions(s) / UC Diagnoses   Final diagnoses:  Strain of lumbar region, initial encounter    ED Discharge Orders        Ordered    cyclobenzaprine (FLEXERIL) 10 MG tablet  3 times daily PRN     02/02/18 1044    traMADol (ULTRAM) 50 MG tablet     02/02/18 1044     1. diagnosis reviewed with patient 2. rx as per orders above; reviewed possible side effects, interactions, risks and benefits  3. Recommend supportive treatment with warm compresses 4. Follow-up prn if symptoms worsen or don't improve  Controlled Substance  Prescriptions [place] Controlled Substance Registry consulted? Not Applicable   Norval Gable, MD 02/02/18 1255

## 2019-02-09 ENCOUNTER — Ambulatory Visit
Admission: RE | Admit: 2019-02-09 | Discharge: 2019-02-09 | Disposition: A | Discharge status: Home / Self Care | Payer: Medicare Other | Source: Ambulatory Visit | Attending: Family Medicine | Admitting: Family Medicine

## 2019-02-09 ENCOUNTER — Other Ambulatory Visit: Payer: Self-pay

## 2019-02-09 ENCOUNTER — Ambulatory Visit
Admission: RE | Admit: 2019-02-09 | Discharge: 2019-02-09 | Disposition: A | Discharge status: Home / Self Care | Payer: Medicare Other | Attending: Family Medicine | Admitting: Family Medicine

## 2019-02-09 ENCOUNTER — Other Ambulatory Visit: Payer: Self-pay | Admitting: Family Medicine

## 2019-02-09 DIAGNOSIS — G8929 Other chronic pain: Secondary | ICD-10-CM | POA: Diagnosis present

## 2019-02-09 DIAGNOSIS — M545 Low back pain, unspecified: Secondary | ICD-10-CM

## 2019-02-09 IMAGING — CR THORACIC SPINE 2 VIEWS
3 series · 4 of 4 positions shown · non-contrast
Comparison: None.

CLINICAL DATA: Dorsalgia

EXAM:
THORACIC SPINE 3 VIEWS

[t-spine ap]
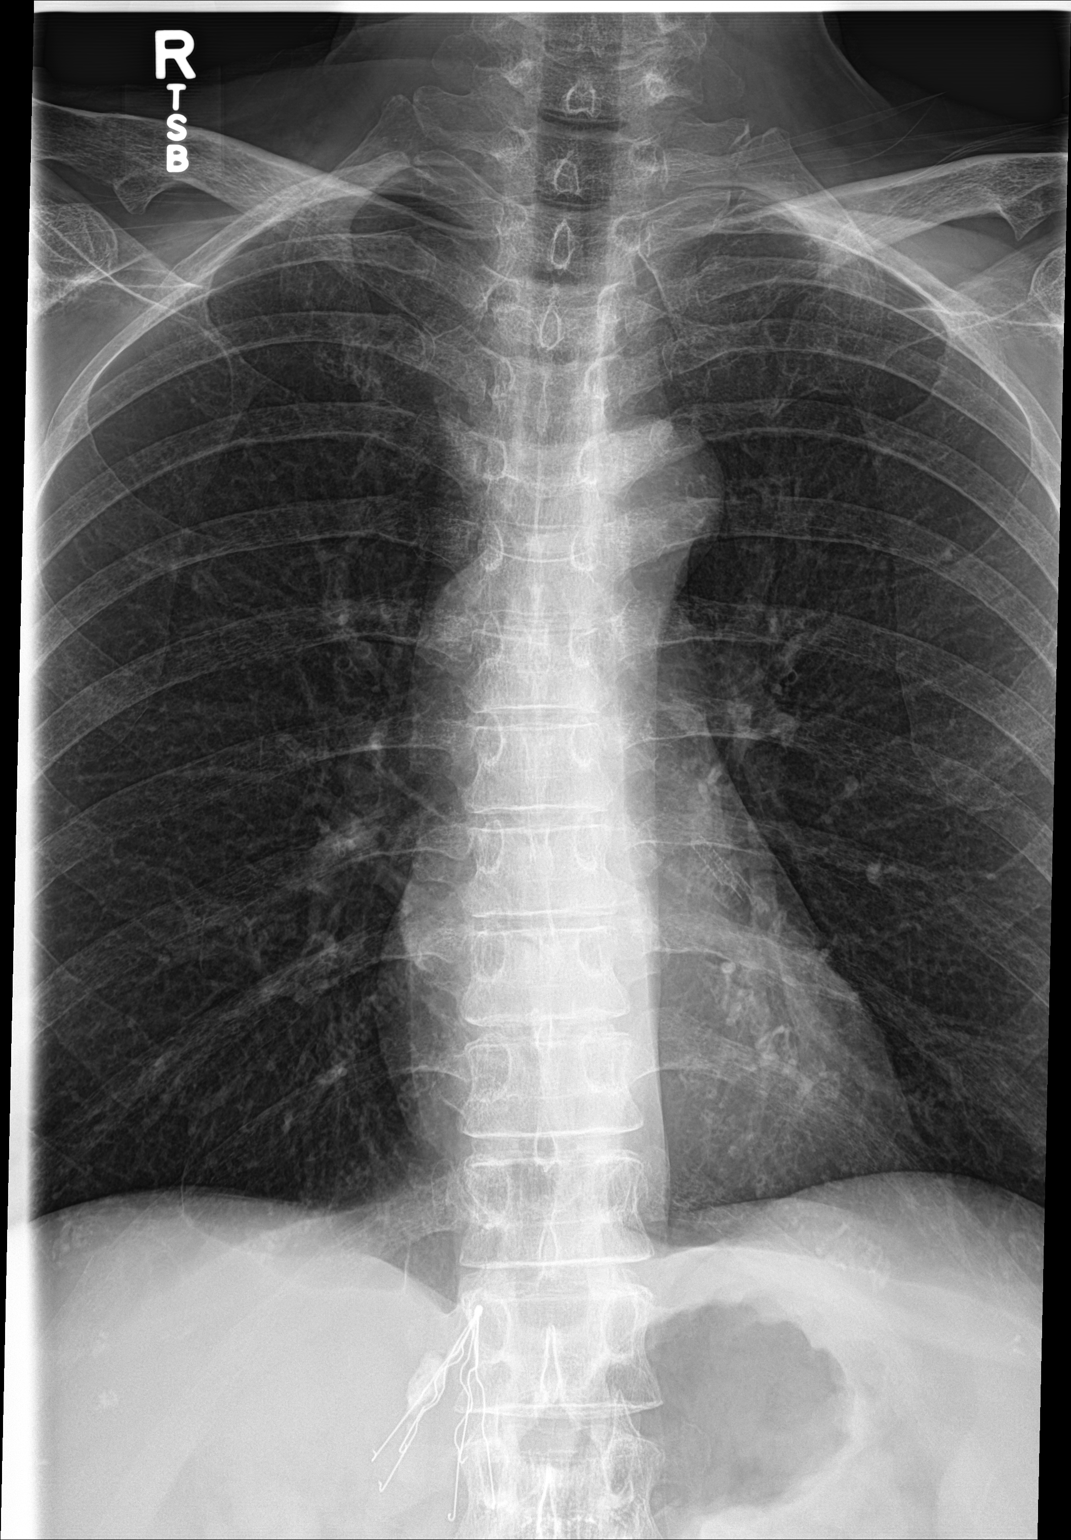

[Series 2: t-spine lat · 0.14mm/px · 2 of 2 slices shown]
[im 1/2]
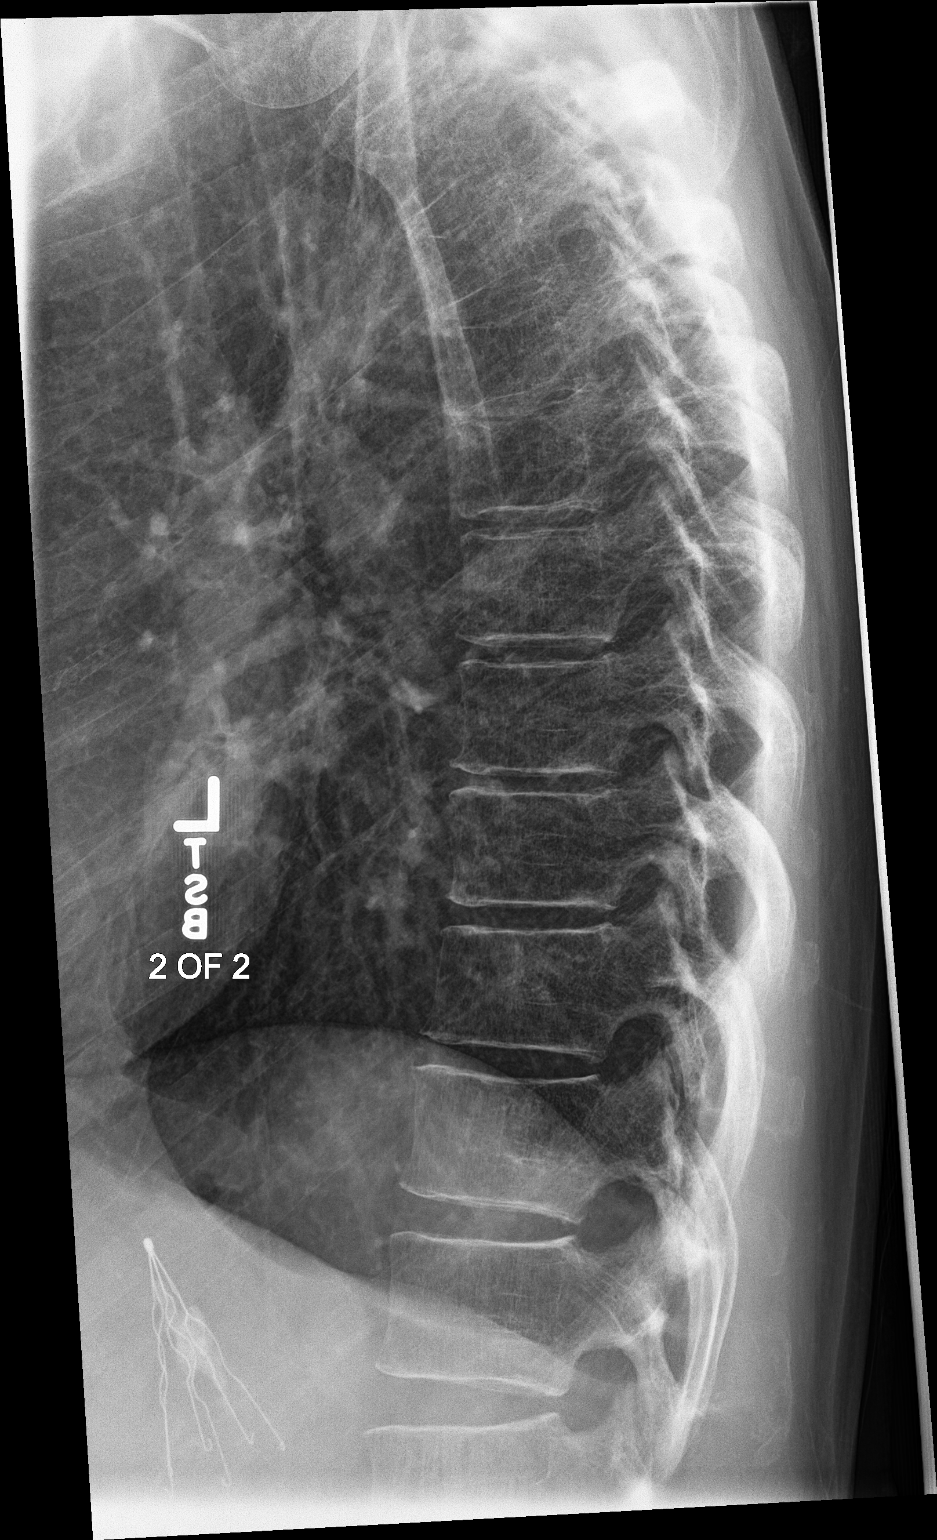
[im 2/2]
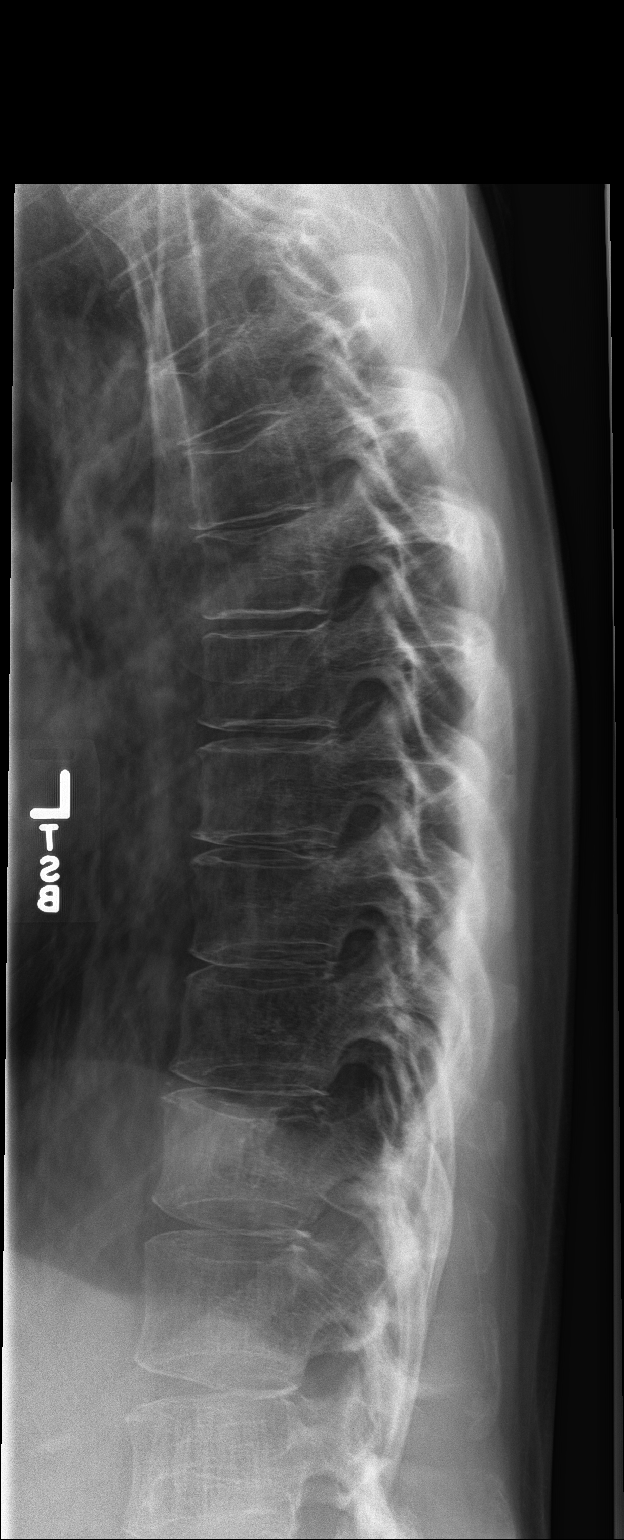

[t-spine swimmers]
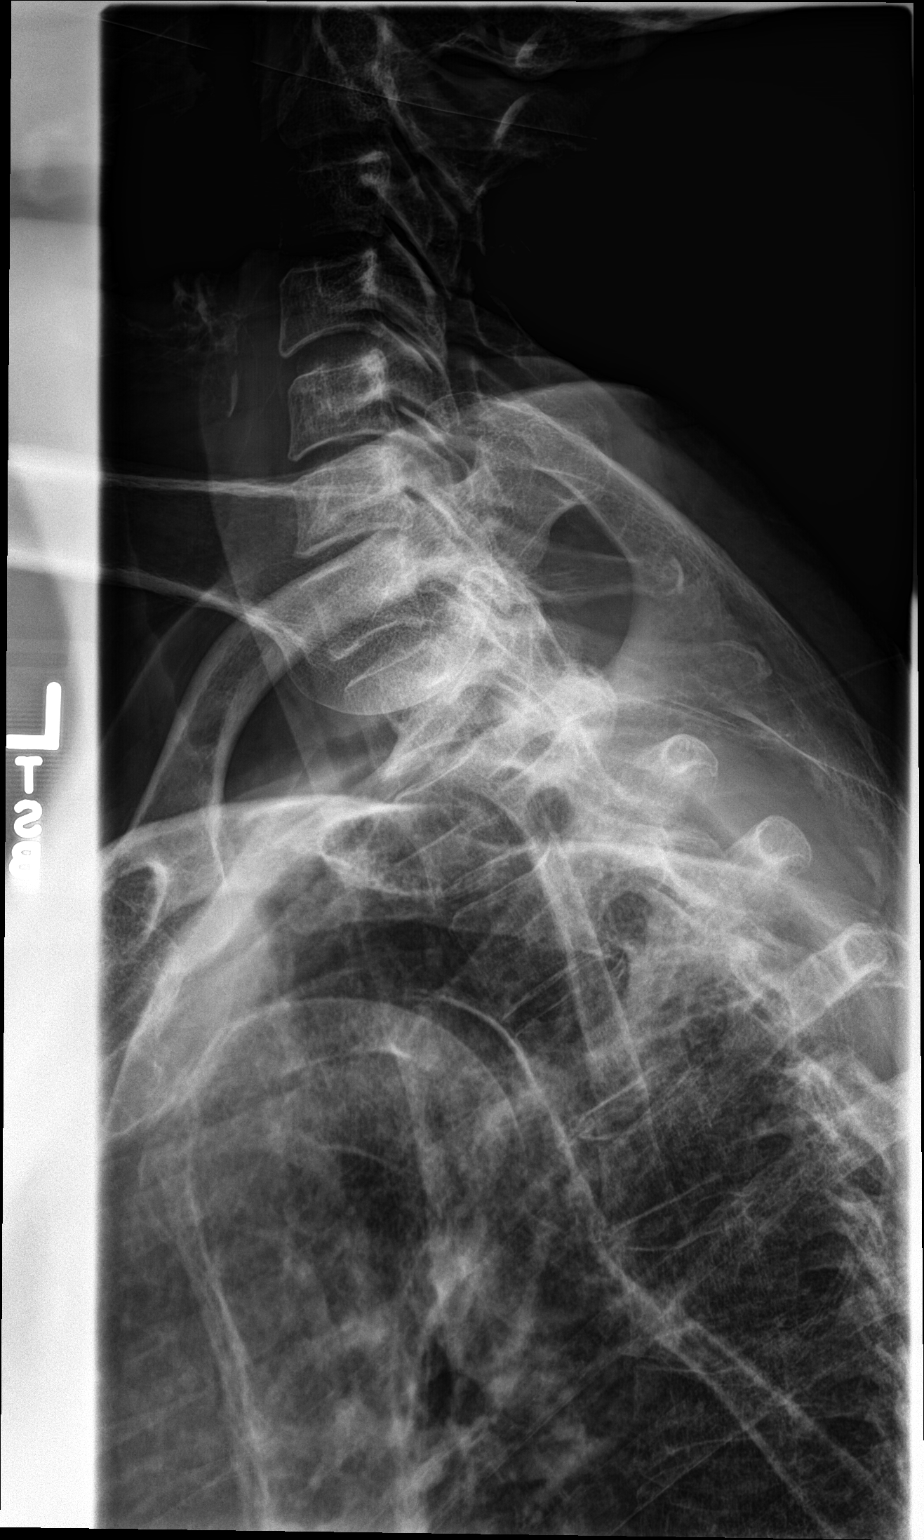

[4 of 4 positions shown; findings below may reference images not displayed]

FINDINGS: Frontal, lateral, and swimmer's views were obtained. There is slight
concavity along the superior endplates of T5, T6, and T7. No frank
wedging is noted at these levels. No other evident fracture. No
spondylolisthesis. There is slight disc space narrowing at several
levels in the thoracic region. No paraspinous lesions or erosive
change evident. A filter is noted in the inferior vena cava.
IMPRESSION: Mild superior endplate concavity at T5, T6, and T7 of uncertain
etiology. No other evident fracture. No spondylolisthesis. Areas of
mild arthropathy at several levels.

## 2019-02-09 IMAGING — CR LUMBAR SPINE - 2-3 VIEW
3 series · 3 of 3 positions shown · non-contrast
Comparison: None.

CLINICAL DATA: Pain following recent twisting injury

EXAM:
LUMBAR SPINE - 2-3 VIEW

[l-spine ap]
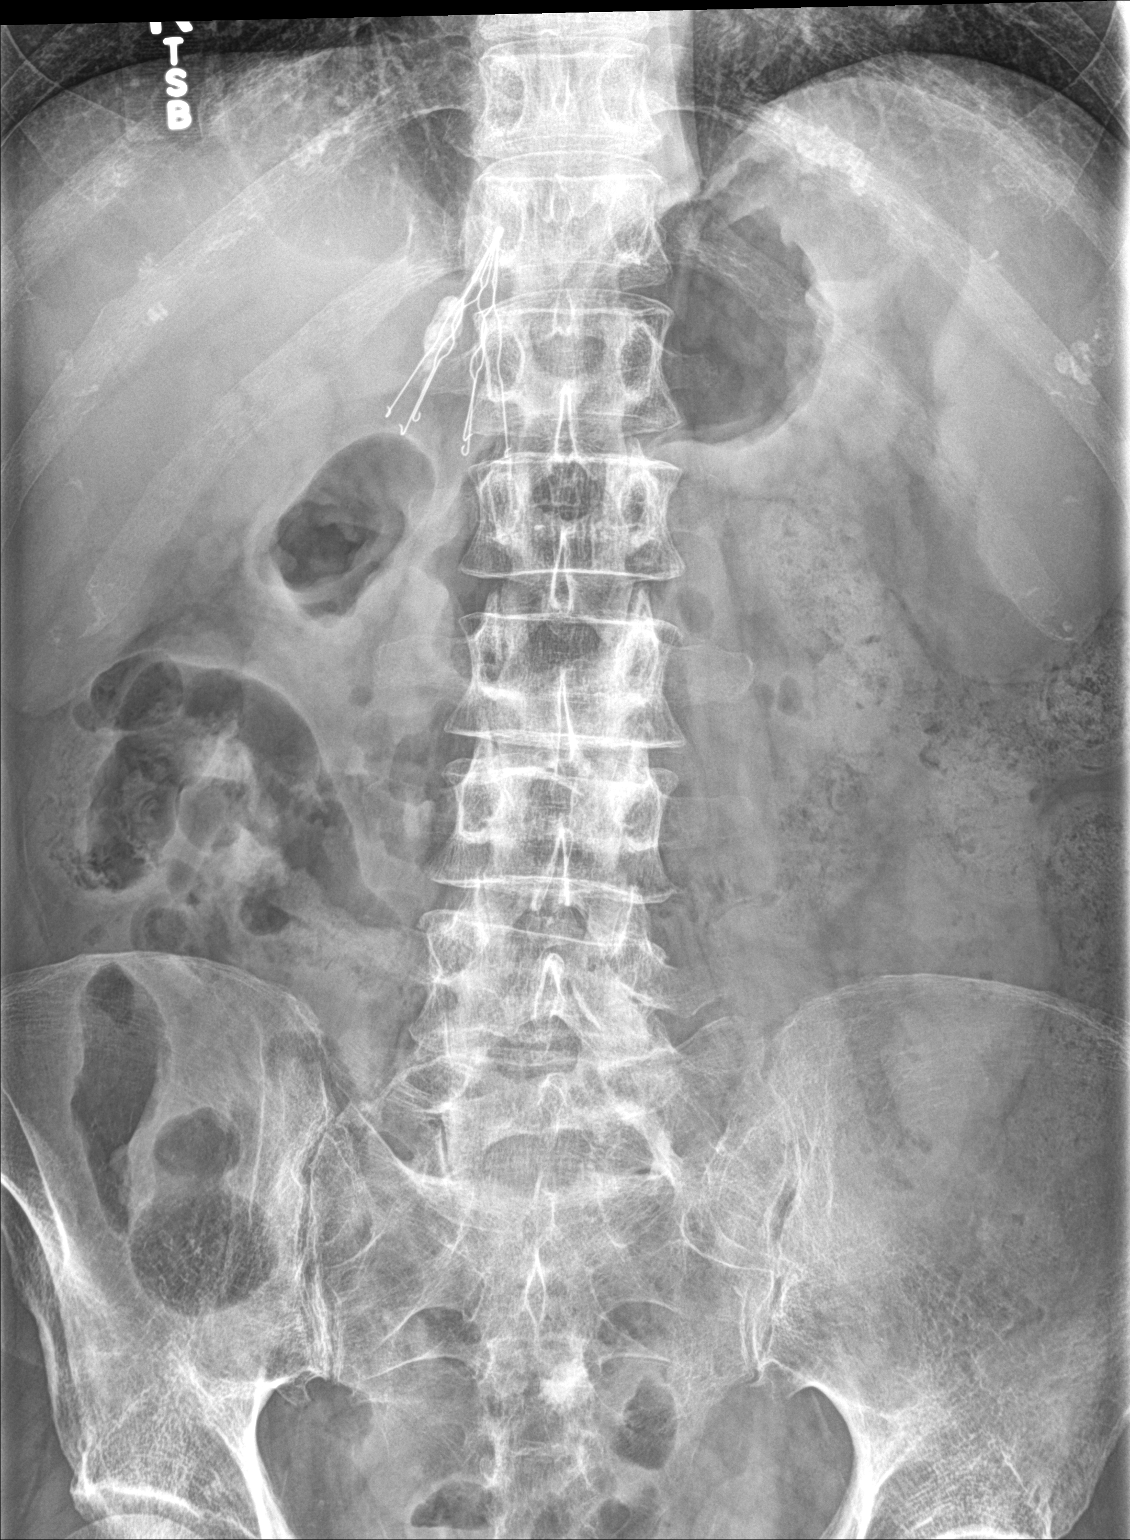

[l-spine lat]
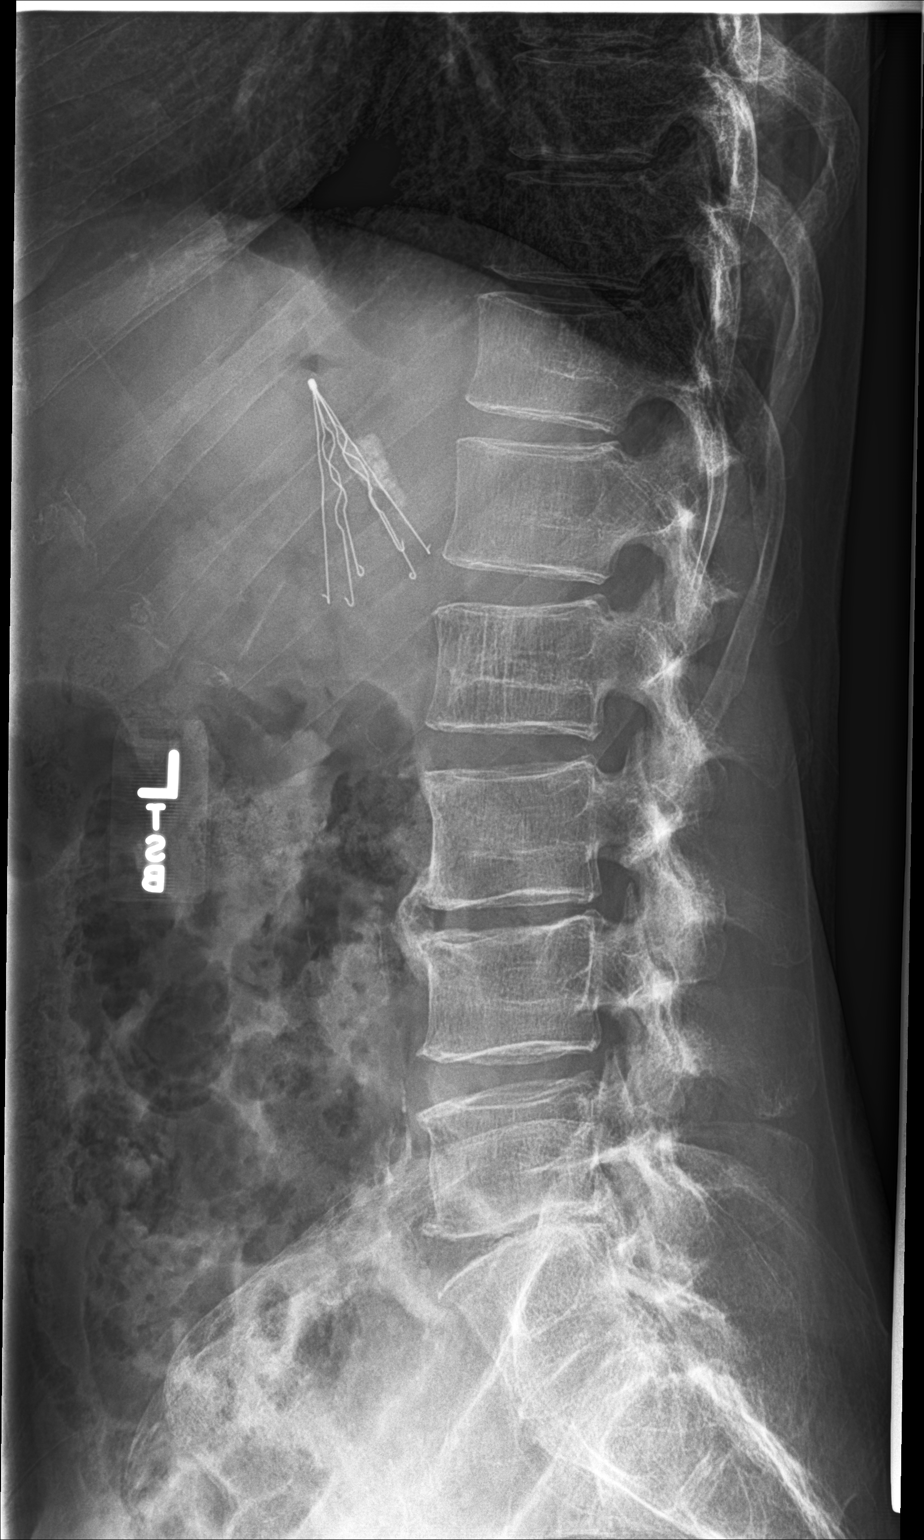

[l-spine spot]
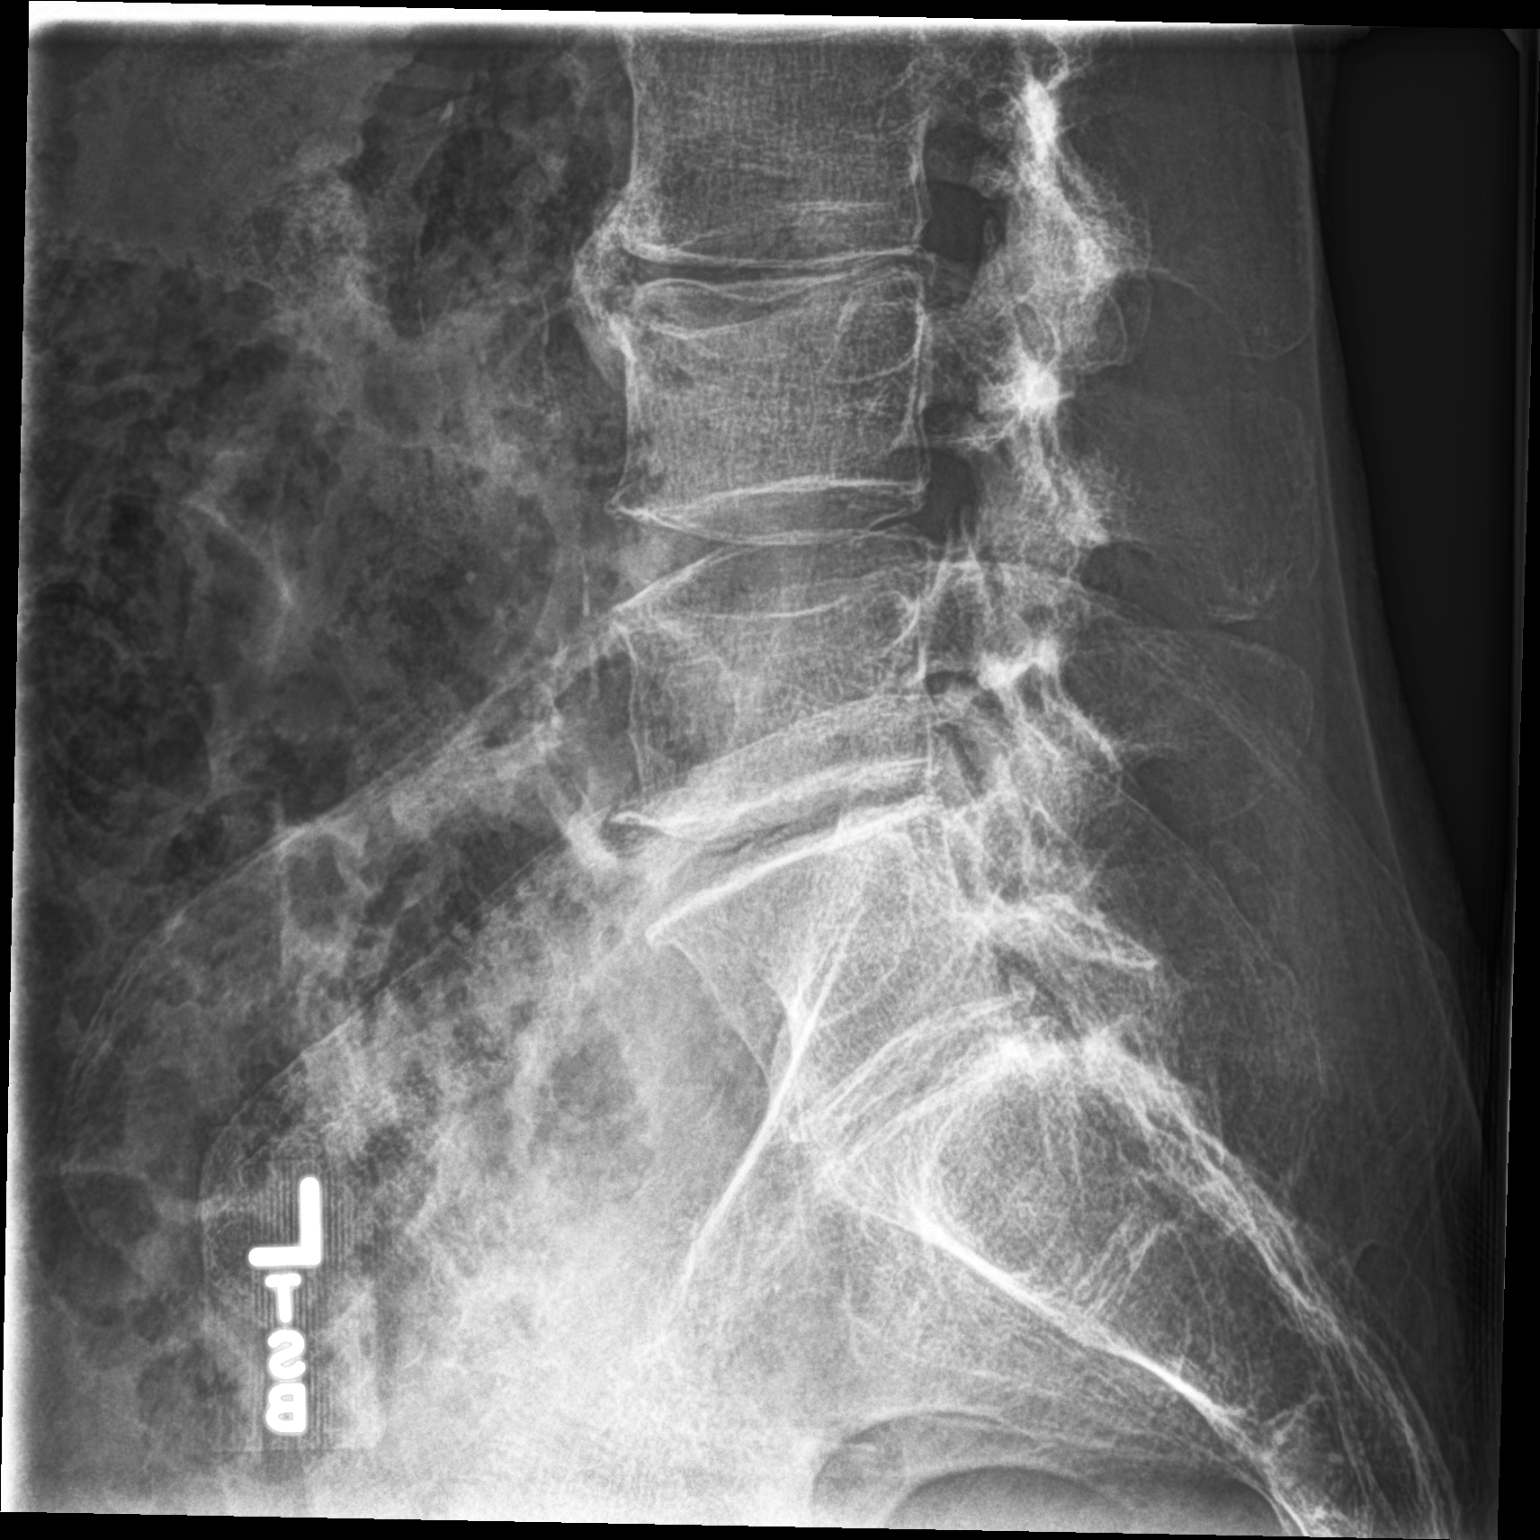

[3 of 3 positions shown; findings below may reference images not displayed]

FINDINGS: Frontal, lateral, and spot lumbosacral lateral images were obtained.
There are 5 non-rib-bearing lumbar type vertebral bodies. There is
slight lumbar levoscoliosis. There is no fracture or
spondylolisthesis. There is moderately severe disc space narrowing
at L5-S1. There is milder disc space narrowing at L3-4 and L4-5.
Bones appear somewhat osteoporotic. There is a filter in the
inferior vena cava.
IMPRESSION: No fracture or spondylolisthesis. Osteoarthritic change at L5-S1 as
well as to a lesser extent at L3-4 and L4-5. Filter in inferior vena
cava.

## 2021-03-05 ENCOUNTER — Encounter: Payer: Self-pay | Admitting: Emergency Medicine

## 2021-03-05 ENCOUNTER — Ambulatory Visit (INDEPENDENT_AMBULATORY_CARE_PROVIDER_SITE_OTHER): Payer: Medicare Other

## 2021-03-05 ENCOUNTER — Ambulatory Visit
Admission: EM | Admit: 2021-03-05 | Discharge: 2021-03-05 | Disposition: A | Discharge status: Home / Self Care | Payer: Medicare Other | Attending: Family Medicine | Admitting: Family Medicine

## 2021-03-05 ENCOUNTER — Other Ambulatory Visit: Payer: Self-pay

## 2021-03-05 DIAGNOSIS — R0781 Pleurodynia: Secondary | ICD-10-CM

## 2021-03-05 DIAGNOSIS — X58XXXA Exposure to other specified factors, initial encounter: Secondary | ICD-10-CM | POA: Diagnosis not present

## 2021-03-05 DIAGNOSIS — S299XXA Unspecified injury of thorax, initial encounter: Secondary | ICD-10-CM | POA: Diagnosis not present

## 2021-03-05 MED ORDER — TRAMADOL HCL 50 MG PO TABS: 50.0000 mg | ORAL_TABLET | Freq: Three times a day (TID) | ORAL | 0 refills | Status: AC | PRN

## 2021-03-05 NOTE — ED Provider Notes (Signed)
[facility]    CSN: [id number] Arrival date & time: 03/05/21  0840      History   Chief Complaint Chief Complaint  Patient presents with  . Rib Injury    DOI 02/28/21   HPI  60 year old female presents with the above complaint.  Patient reports that on Friday she was at a friend's house.  She was playing with her dog and the dog jumped on her and she fell backwards onto the couch and the dog jumped on her again.  She reports left-sided rib pain since then.  She has taken over-the-counter Tylenol without resolution.  Denies any bruising.  She states her pain is sharp and 10/10 in severity.  No other reported symptoms.  No other complaints.   Past Medical History:  Diagnosis Date  . Heart disease    heart attack - no stents  . Hx of blood clots    legs  . Hypertension   . Stroke [facility])    Past Surgical History:  Procedure Laterality Date  . APPENDECTOMY      OB History   No obstetric history on file.      Home Medications    Prior to Admission medications   Medication Sig Start Date End Date Taking? Authorizing Provider  cyclobenzaprine (FLEXERIL) 10 MG tablet Take 1 tablet (10 mg total) by mouth 3 (three) times daily as needed for muscle spasms. 02/02/18  Yes Norval Gable, MD  metoprolol succinate (TOPROL-XL) 25 MG 24 hr tablet Take 25 mg by mouth daily.   Yes [provider]  traMADol (ULTRAM) 50 MG tablet Take 1 tablet (50 mg total) by mouth every 8 (eight) hours as needed for moderate pain or severe pain. 03/05/21  Yes Srijan Givan G, DO  warfarin (COUMADIN) 1 MG tablet Take 1 mg by mouth daily.   Yes [provider]  warfarin (COUMADIN) 2.5 MG tablet Take 2.5 mg by mouth daily.   Yes [provider]  rosuvastatin (CRESTOR) 20 MG tablet Take 20 mg by mouth daily.  03/05/21  [provider]    Family History Family History  Problem Relation Age of Onset  . Other Mother        "old age"  . Heart attack Father      Social History Social History   Tobacco Use  . Smoking status: Current Every Day Smoker    Packs/day: 1.00    Years: 40.00    Pack years: 40.00    Types: Cigarettes  . Smokeless tobacco: Never Used  Vaping Use  . Vaping Use: Never used  Substance Use Topics  . Alcohol use: Yes    Comment: rarely  . Drug use: No     Allergies   Penicillins   Review of Systems Review of Systems  Constitutional: Negative.   Musculoskeletal:       Rib pain.   Physical Exam Triage Vital Signs ED Triage Vitals  Enc Vitals Group     BP 03/05/21 0850 122/85     Pulse Rate 03/05/21 0850 (!) 114     Resp 03/05/21 0850 20     Temp 03/05/21 0850 97.8 F (36.6 C)     Temp Source 03/05/21 0850 Oral     SpO2 03/05/21 0850 98 %     Weight 03/05/21 0851 131 lb (59.4 kg)     Height 03/05/21 0851 5\' 8"  (1.727 m)     Head Circumference --      Peak Flow --  Pain Score 03/05/21 0850 10     Pain Loc --      Pain Edu? --      Excl. in [place]? --    No data found.  Updated Vital Signs BP 122/85 (BP Location: Left Arm)   Pulse (!) 114   Temp 97.8 F (36.6 C) (Oral)   Resp 20   Ht 5\' 8"  (1.727 m)   Wt 59.4 kg   SpO2 98%   BMI 19.92 kg/m   Visual Acuity Right Eye Distance:   Left Eye Distance:   Bilateral Distance:    Right Eye Near:   Left Eye Near:    Bilateral Near:     Physical Exam Constitutional:      General: She is not in acute distress.    Appearance: Normal appearance.  HENT:     Head: Normocephalic and atraumatic.  Eyes:     General:        Right eye: No discharge.        Left eye: No discharge.     Conjunctiva/sclera: Conjunctivae normal.  Cardiovascular:     Rate and Rhythm: Normal rate and regular rhythm.  Pulmonary:     Effort: Pulmonary effort is normal.     Breath sounds: No wheezing or rales.  Chest:       Comments: Tenderness at the labelled location.  No bruising. Neurological:     Mental Status: She is alert.  Psychiatric:        Mood and  Affect: Mood normal.        Behavior: Behavior normal.    UC Treatments / Results  Labs (all labs ordered are listed, but only abnormal results are displayed) Labs Reviewed - No data to display  EKG   Radiology DG Ribs Unilateral W/Chest Left  Result Date: 03/05/2021 CLINICAL DATA:  Acute left rib pain after injury last week. EXAM: LEFT RIBS AND CHEST - 3+ VIEW COMPARISON:  None. FINDINGS: No fracture or other bone lesions are seen involving the ribs. There is no evidence of pneumothorax or pleural effusion. Both lungs are clear. Heart size and mediastinal contours are within normal limits. IMPRESSION: Negative. Electronically Signed   By: Marijo Conception M.D.   On: 03/05/2021 09:23    Procedures Procedures (including critical care time)  Medications Ordered in UC Medications - No data to display  Initial Impression / Assessment and Plan / UC Course  I have reviewed the triage vital signs and the nursing notes.  Pertinent labs & imaging results that were available during my care of the patient were reviewed by me and considered in my medical decision making (see chart for details).    60 year old female presents with rib pain after suffering an injury.  X-ray was obtained and was independent reviewed by me.  Interpretation: No apparent rib fracture.  Treating symptomatically with tramadol.  Supportive care.  Work note given.  Final Clinical Impressions(s) / UC Diagnoses   Final diagnoses:  Rib pain     Discharge Instructions     Rest.  Heat.  Medication as prescribed.  Take care  Dr. Lacinda Axon     ED Prescriptions    Medication Sig Dispense Auth. Provider   traMADol (ULTRAM) 50 MG tablet Take 1 tablet (50 mg total) by mouth every 8 (eight) hours as needed for moderate pain or severe pain. 15 tablet Thersa Salt G, DO     I have reviewed the PDMP during this encounter.   Thersa Salt  Crawford, DO 03/05/21 6725

## 2021-03-05 NOTE — Discharge Instructions (Addendum)
Rest.  Heat.  Medication as prescribed.  Take care  Dr. Lacinda Axon

## 2021-03-05 NOTE — ED Triage Notes (Signed)
Patient in today c/o left sided rib pain x 5 days. Patient states she was playing with her friend's dog Occupational hygienist) jumped on her and she fell back onto the couch and the dog jumped on her again. Patient has been having rib pain since. Patient has taken OTC Tylenol for the pain.

## 2021-04-22 ENCOUNTER — Other Ambulatory Visit: Payer: Self-pay | Admitting: Family Medicine

## 2021-04-22 DIAGNOSIS — R634 Abnormal weight loss: Secondary | ICD-10-CM

## 2021-04-22 DIAGNOSIS — F17200 Nicotine dependence, unspecified, uncomplicated: Secondary | ICD-10-CM

## 2021-04-22 DIAGNOSIS — R0602 Shortness of breath: Secondary | ICD-10-CM

## 2021-04-22 DIAGNOSIS — R630 Anorexia: Secondary | ICD-10-CM

## 2021-04-30 ENCOUNTER — Other Ambulatory Visit: Payer: Self-pay

## 2021-04-30 ENCOUNTER — Ambulatory Visit
Admission: RE | Admit: 2021-04-30 | Discharge: 2021-04-30 | Disposition: A | Discharge status: Home / Self Care | Payer: Medicare Other | Source: Ambulatory Visit | Attending: Family Medicine | Admitting: Family Medicine

## 2021-04-30 DIAGNOSIS — R630 Anorexia: Secondary | ICD-10-CM

## 2021-04-30 DIAGNOSIS — R0602 Shortness of breath: Secondary | ICD-10-CM | POA: Insufficient documentation

## 2021-04-30 DIAGNOSIS — F17200 Nicotine dependence, unspecified, uncomplicated: Secondary | ICD-10-CM | POA: Diagnosis present

## 2021-04-30 DIAGNOSIS — R634 Abnormal weight loss: Secondary | ICD-10-CM | POA: Diagnosis not present

## 2021-04-30 IMAGING — CT CT CHEST W/ CM
1 series · 14 of 34 positions shown, 18 images · IV contrast (omnipaque)
Comparison: None.

CLINICAL DATA: Unintentional weight loss and loss of appetite for
both 2 weeks.

Shortness of breath.
EXAM:
CT CHEST WITH CONTRAST
TECHNIQUE: Multidetector CT imaging of the chest was performed during
intravenous contrast administration.
CONTRAST:  60mL OMNIPAQUE IOHEXOL 300 MG/ML  SOLN

[Series 3: axial st · axial · 0.65mm/px · z∈[-676,-398]mm · 14 of 165 slices shown, 18 images]
[im 13/165  mediastinal]
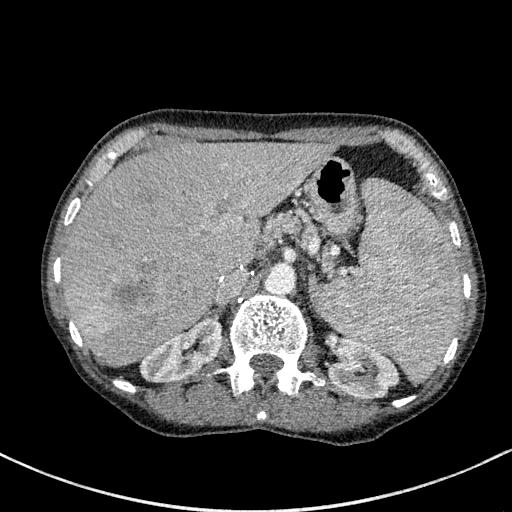
[im 13/165  lung]
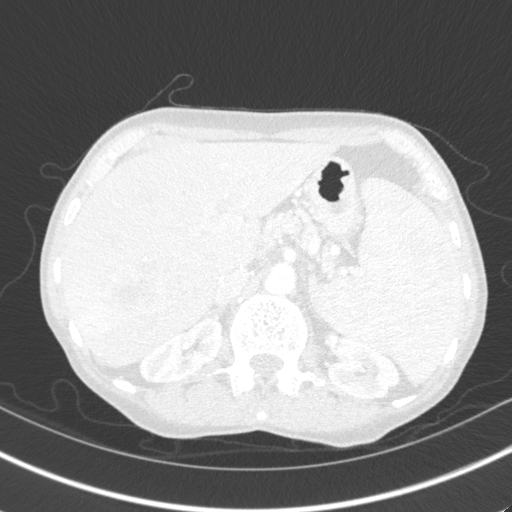
[im 25/165  lung]
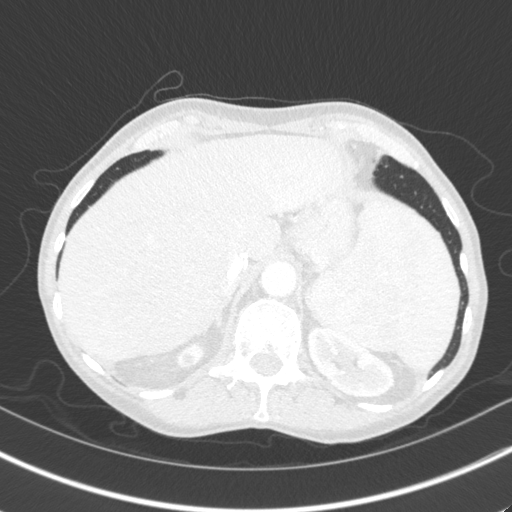
[im 33/165  lung]
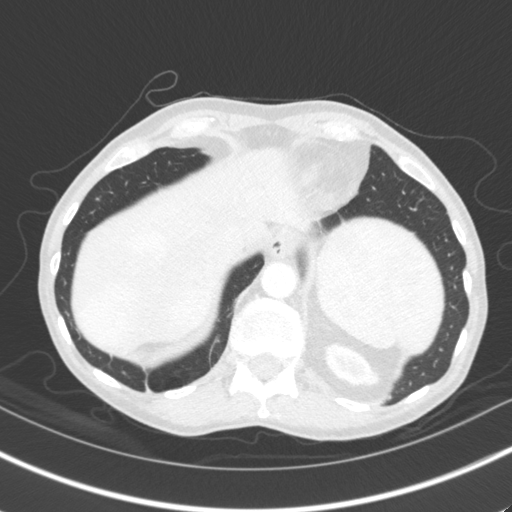
[im 49/165  lung]
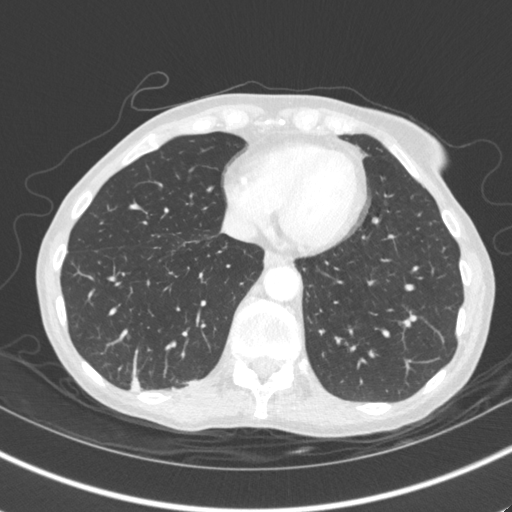
[im 61/165  mediastinal]
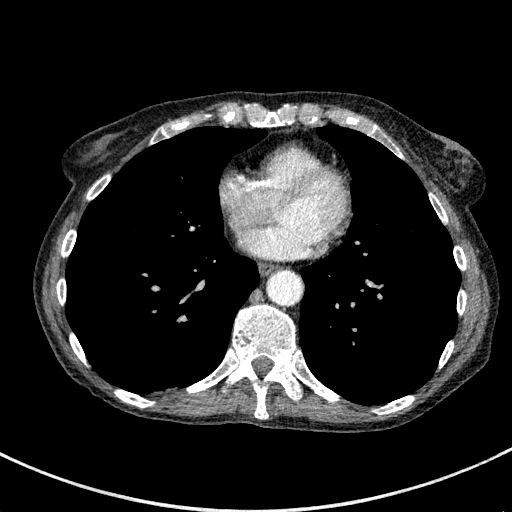
[im 61/165  lung]
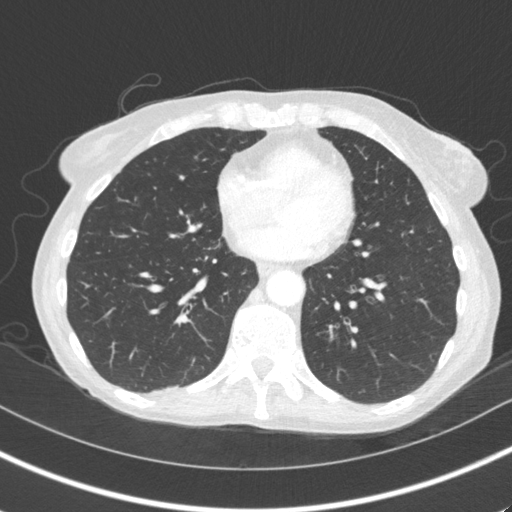
[im 67/165  lung]
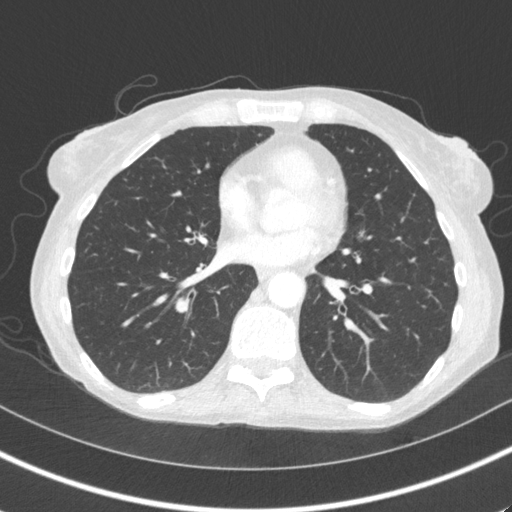
[im 78/165  lung]
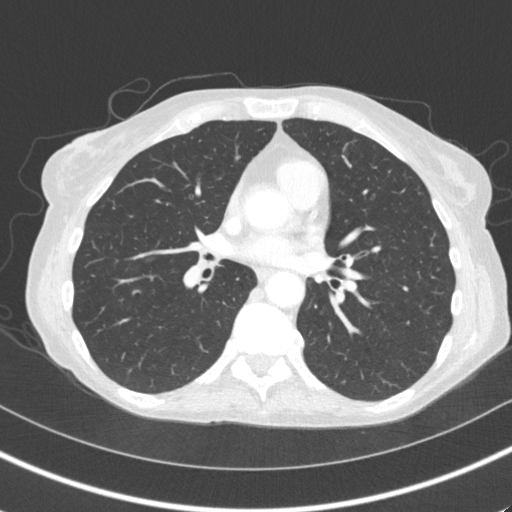
[im 88/165  lung]
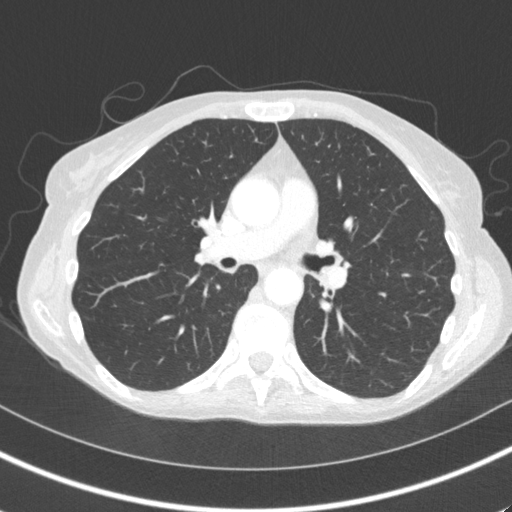
[im 98/165  mediastinal]
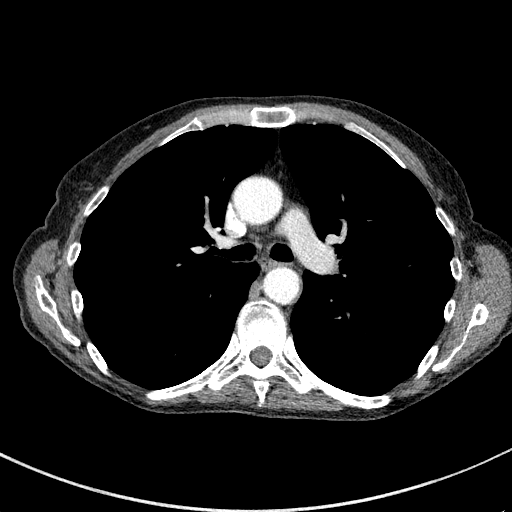
[im 98/165  lung]
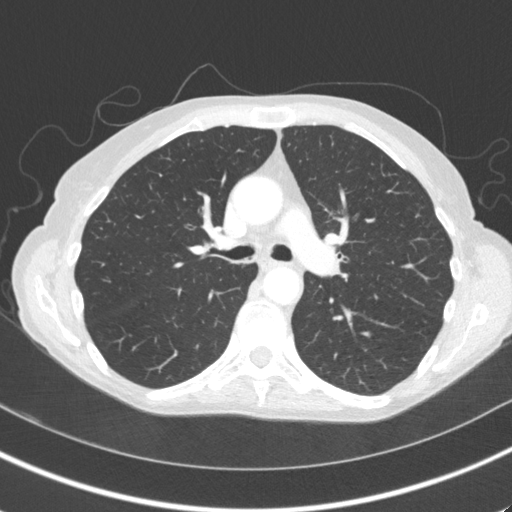
[im 104/165  lung]
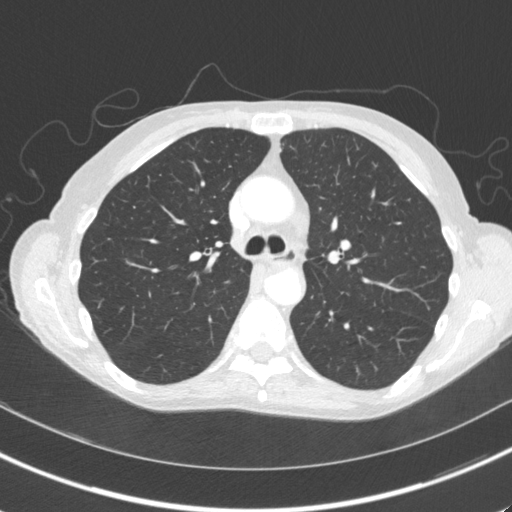
[im 122/165  lung]
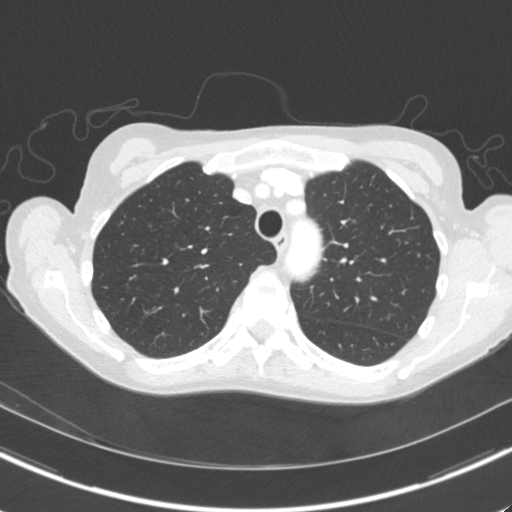
[im 132/165  lung]
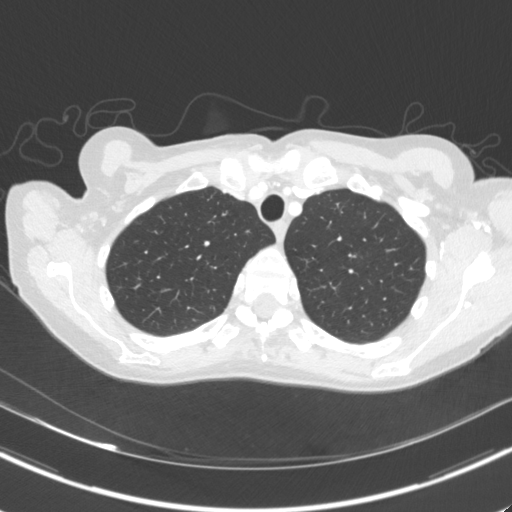
[im 140/165  mediastinal]
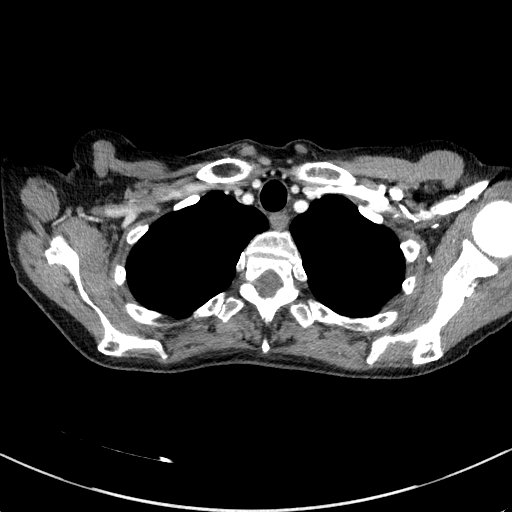
[im 140/165  lung]
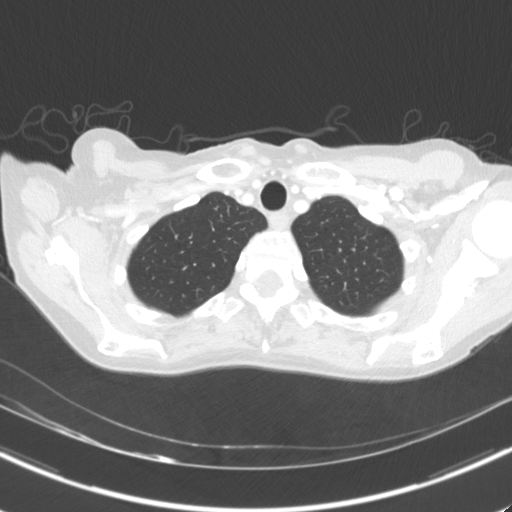
[im 152/165  lung]
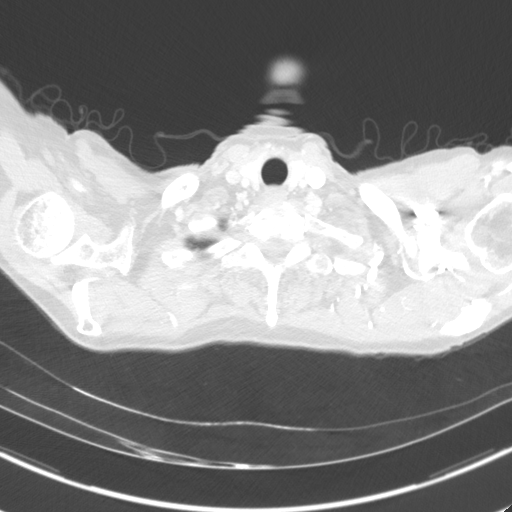

[14 of 34 positions shown; findings below may reference images not displayed]

FINDINGS: Cardiovascular: Normal heart size. Calcific atherosclerotic disease
of the coronary arteries and aorta. Normal caliber of the aorta. No
central pulmonary embolus.

Mediastinum/Nodes: No enlarged mediastinal, hilar, or axillary lymph
nodes. Thyroid gland, trachea, and esophagus demonstrate no
significant findings.

Lungs/Pleura: 2 subpleural pulmonary nodules in the right lower lobe
measure three and 5 mm, images 125 and 123/165. A third tiny 2 mm
ground-glass pulmonary nodule in the periphery of the right lower
lobe, image 115/165. 5 mm soft tissue nodule associated with a lung
cyst in the superior segment of the right lower lobe is, image
89/165. 3 mm soft tissue pulmonary nodule in the right upper lobe,
image 28/165. Bibasilar scarring versus atelectasis

Upper Abdomen: Several ill-defined irregular hypoattenuated and
hyper attenuated liver masses on the background of heterogeneous
attenuation of the liver parenchyma. One such prominent mass in the
right lobe of the liver measures 3.4 x 3.4 by 4.5 cm and is
incompletely visualized. The gallbladder is not seen. 5.5 cm soft
tissue attenuation within the liver hilum, incompletely visualized.

Splenomegaly.  The spleen length measures 13.6 cm.

IVC filter in place. Partially visualized linear metallic density
within the soft tissue mass at the hilum of the liver. Does this
patient have known pancreatic or gallbladder carcinoma with stent
placement?

Musculoskeletal: No definite suspicious lesions. Suspected
hemangiomas within several thoracic vertebral bodies.
IMPRESSION: 1. Several ill-defined irregular hypoattenuated and hyper attenuated
liver masses on the background of heterogeneous attenuation of the
liver parenchyma. One such prominent mass in the right lobe of the
liver measures 3.4 x 3.4 x 4.5 cm and is incompletely visualized.
These findings are highly suspicious for metastatic disease or
primary hepatocellular carcinoma.
2. 5.5 cm soft tissue attenuation within the liver hilum,
incompletely visualized. Etiology uncertain.
3. Partially visualized linear metallic density within the soft
tissue density at the hilum of the liver. Does this patient have
known pancreatic or cholangiocarcinoma with stent placement?
4. Splenomegaly.
5. Calcific atherosclerotic disease of the coronary arteries and
aorta.
6. Aortic atherosclerosis. Several soft tissue and ground-glass
nodules within right lung. These may represent metastatic disease
versus lung granulomas.
7. Dedicated abdominal imaging may be considered for further
characterization of the abdominal findings, if such imaging is
unavailable.

Aortic Atherosclerosis ([I7]-[I7]).

## 2021-04-30 MED ORDER — IOHEXOL 300 MG/ML  SOLN
75.0000 mL | Freq: Once | INTRAMUSCULAR | Status: AC | PRN
  Administered 2021-04-30: 60 mL via INTRAVENOUS

## 2021-05-12 ENCOUNTER — Ambulatory Visit: Payer: Medicare Other

## 2021-06-10 ENCOUNTER — Encounter: Payer: Self-pay | Admitting: Emergency Medicine

## 2021-06-10 ENCOUNTER — Other Ambulatory Visit: Payer: Self-pay

## 2021-06-10 ENCOUNTER — Ambulatory Visit
Admission: EM | Admit: 2021-06-10 | Discharge: 2021-06-10 | Disposition: A | Discharge status: Home / Self Care | Payer: Medicare Other | Attending: Sports Medicine | Admitting: Sports Medicine

## 2021-06-10 DIAGNOSIS — Z8249 Family history of ischemic heart disease and other diseases of the circulatory system: Secondary | ICD-10-CM | POA: Insufficient documentation

## 2021-06-10 DIAGNOSIS — R0981 Nasal congestion: Secondary | ICD-10-CM

## 2021-06-10 DIAGNOSIS — R059 Cough, unspecified: Secondary | ICD-10-CM

## 2021-06-10 DIAGNOSIS — Z79899 Other long term (current) drug therapy: Secondary | ICD-10-CM | POA: Insufficient documentation

## 2021-06-10 DIAGNOSIS — J069 Acute upper respiratory infection, unspecified: Secondary | ICD-10-CM

## 2021-06-10 DIAGNOSIS — Z720 Tobacco use: Secondary | ICD-10-CM

## 2021-06-10 DIAGNOSIS — Z7901 Long term (current) use of anticoagulants: Secondary | ICD-10-CM | POA: Insufficient documentation

## 2021-06-10 DIAGNOSIS — I10 Essential (primary) hypertension: Secondary | ICD-10-CM | POA: Insufficient documentation

## 2021-06-10 DIAGNOSIS — U071 COVID-19: Secondary | ICD-10-CM | POA: Diagnosis not present

## 2021-06-10 DIAGNOSIS — F17210 Nicotine dependence, cigarettes, uncomplicated: Secondary | ICD-10-CM | POA: Diagnosis not present

## 2021-06-10 DIAGNOSIS — R062 Wheezing: Secondary | ICD-10-CM | POA: Diagnosis not present

## 2021-06-10 DIAGNOSIS — Z8673 Personal history of transient ischemic attack (TIA), and cerebral infarction without residual deficits: Secondary | ICD-10-CM | POA: Insufficient documentation

## 2021-06-10 DIAGNOSIS — Z880 Allergy status to penicillin: Secondary | ICD-10-CM | POA: Insufficient documentation

## 2021-06-10 LAB — SARS CORONAVIRUS 2 (TAT 6-24 HRS): SARS Coronavirus 2: POSITIVE — AB

## 2021-06-10 MED ORDER — ALBUTEROL SULFATE HFA 108 (90 BASE) MCG/ACT IN AERS: 1.0000 | INHALATION_SPRAY | Freq: Four times a day (QID) | RESPIRATORY_TRACT | 0 refills | Status: AC | PRN

## 2021-06-10 MED ORDER — PROMETHAZINE-DM 6.25-15 MG/5ML PO SYRP: 5.0000 mL | ORAL_SOLUTION | Freq: Four times a day (QID) | ORAL | 0 refills | Status: DC | PRN

## 2021-06-10 MED ORDER — FLUTICASONE PROPIONATE 50 MCG/ACT NA SUSP: 2.0000 | Freq: Every day | NASAL | 0 refills | Status: AC

## 2021-06-10 NOTE — Discharge Instructions (Addendum)
As we discussed, you have a upper respiratory infection.  I sent in a prescription for an inhaler, a cough medicine, and a nasal spray to help with your nasal congestion.  Please use as directed. Your COVID test is pending at the time of discharge and someone will contact you with a positive result.  You can follow along with the app on your phone called MyChart.  Instructions are on your discharge summary. Tylenol or Motrin for any fever or discomfort. Plenty of rest and plenty of fluids. I did not give you a work note as you said you are not working today. If your symptoms persist you need to see your primary care provider. If they worsen then please go to the ER. As we discussed you are on Coumadin and I am not seeing a recent lab value monitoring your levels.  Please discuss that with your primary care provider.  This is something that you need to have checked on a regular basis.

## 2021-06-10 NOTE — ED Triage Notes (Signed)
PT has a chronic cough and runny nose. She has an upcoming procedure and needs a COVID test.

## 2021-06-10 NOTE — ED Provider Notes (Signed)
[facility]    CSN: [id number] Arrival date & time: 06/10/21  0801      History   Chief Complaint Chief Complaint  Patient presents with   Cough    HPI Cheryl Baldwin is a 60 y.o. female.   60 year old female who presents for evaluation of URI symptoms.  Normally sees [facility] for ongoing medical care.  She does have a history of hypertension.  Also a remote history of a CVA 30 years ago.  She is on Coumadin.  I do not see that her levels are being checked regularly and I have counseled the patient on that.  Her presentation today is cough, nasal congestion, postnasal drip.  She is a smoker and smokes 1 pack a day.  She has a little bit of shortness of breath but no chest pain.  Some mild nausea with coughing.  No vomiting or diarrhea.  No fever shakes chills.  No abdominal or urinary symptoms.  She has been vaccinated x2 but no booster.  She has not had her flu shot.  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  No red flag signs or symptoms elicited on history.   Past Medical History:  Diagnosis Date   Heart disease    heart attack - no stents   Hx of blood clots    legs   Hypertension    Stroke ([place])     There are no problems to display for this patient.   Past Surgical History:  Procedure Laterality Date   APPENDECTOMY      OB History   No obstetric history on file.      Home Medications    Prior to Admission medications   Medication Sig Start Date End Date Taking? Authorizing Provider  albuterol (VENTOLIN HFA) 108 (90 Base) MCG/ACT inhaler Inhale 1-2 puffs into the lungs every 6 (six) hours as needed for wheezing or shortness of breath. 06/10/21  Yes Verda Cumins, MD  fluticasone [facility]) 50 MCG/ACT nasal spray Place 2 sprays into both nostrils daily. 06/10/21  Yes Verda Cumins, MD  metoprolol succinate (TOPROL-XL) 25 MG 24 hr tablet Take 25 mg by mouth daily.   Yes [provider]  promethazine-dextromethorphan  (PROMETHAZINE-DM) 6.25-15 MG/5ML syrup Take 5 mLs by mouth 4 (four) times daily as needed for cough. 06/10/21  Yes Verda Cumins, MD  warfarin (COUMADIN) 1 MG tablet Take 1 mg by mouth daily.   Yes [provider]  warfarin (COUMADIN) 2.5 MG tablet Take 2.5 mg by mouth daily.   Yes [provider]  cyclobenzaprine (FLEXERIL) 10 MG tablet Take 1 tablet (10 mg total) by mouth 3 (three) times daily as needed for muscle spasms. 02/02/18   Norval Gable, MD  traMADol (ULTRAM) 50 MG tablet Take 1 tablet (50 mg total) by mouth every 8 (eight) hours as needed for moderate pain or severe pain. 03/05/21   Coral Spikes, DO  rosuvastatin (CRESTOR) 20 MG tablet Take 20 mg by mouth daily.  03/05/21  [provider]    Family History Family History  Problem Relation Age of Onset   Other Mother        "old age"   Heart attack Father     Social History Social History   Tobacco Use   Smoking status: Every Day    Packs/day: 1.00    Years: 40.00    Pack years: 40.00    Types: Cigarettes   Smokeless tobacco: Never  Vaping Use   Vaping Use: Never used  Substance Use Topics   Alcohol use: Yes    Comment: rarely   Drug use: No     Allergies   Penicillins   Review of Systems Review of Systems  Constitutional:  Negative for activity change, appetite change, chills, diaphoresis, fatigue and fever.  HENT:  Positive for congestion and postnasal drip. Negative for ear pain, rhinorrhea, sinus pressure, sinus pain, sneezing and sore throat.   Eyes:  Negative for pain.  Respiratory:  Positive for cough, shortness of breath and wheezing. Negative for chest tightness.   Cardiovascular:  Negative for chest pain and palpitations.  Gastrointestinal:  Positive for nausea. Negative for abdominal pain, diarrhea and vomiting.  Genitourinary:  Negative for dysuria.  Musculoskeletal:  Negative for back pain, myalgias and neck pain.  Skin:  Negative for color change, pallor, rash and  wound.  Neurological:  Negative for dizziness, light-headedness and headaches.  All other systems reviewed and are negative.   Physical Exam Triage Vital Signs ED Triage Vitals [06/10/21 0821]  Enc Vitals Group     BP      Pulse      Resp      Temp      Temp src      SpO2      Weight      Height      Head Circumference      Peak Flow      Pain Score 0     Pain Loc      Pain Edu?      Excl. in [place]?    No data found.  Updated Vital Signs BP 100/77   Pulse 98   Temp 98.1 F (36.7 C) (Oral)   Resp 16   SpO2 100%   Visual Acuity Right Eye Distance:   Left Eye Distance:   Bilateral Distance:    Right Eye Near:   Left Eye Near:    Bilateral Near:     Physical Exam Vitals and nursing note reviewed.  Constitutional:      General: She is not in acute distress.    Appearance: Normal appearance. She is not ill-appearing, toxic-appearing or diaphoretic.  HENT:     Head: Normocephalic and atraumatic.     Nose: Congestion present. No rhinorrhea.     Mouth/Throat:     Mouth: Mucous membranes are moist.  Eyes:     General: No scleral icterus.       Right eye: No discharge.        Left eye: No discharge.     Conjunctiva/sclera: Conjunctivae normal.     Pupils: Pupils are equal, round, and reactive to light.  Cardiovascular:     Rate and Rhythm: Normal rate and regular rhythm.     Pulses: Normal pulses.     Heart sounds: Normal heart sounds. No murmur heard.   No friction rub. No gallop.  Pulmonary:     Effort: Pulmonary effort is normal.     Breath sounds: No stridor. Wheezing present. No rhonchi or rales.  Musculoskeletal:     Cervical back: Normal range of motion and neck supple. No rigidity or tenderness.  Skin:    General: Skin is warm and dry.     Capillary Refill: Capillary refill takes less than 2 seconds.     Coloration: Skin is not jaundiced.     Findings: No rash.  Neurological:     General: No focal deficit present.  Mental Status: She is alert  and oriented to person, place, and time.     UC Treatments / Results  Labs (all labs ordered are listed, but only abnormal results are displayed) Labs Reviewed  SARS CORONAVIRUS 2 (TAT 6-24 HRS)    EKG   Radiology No results found.  Procedures Procedures (including critical care time)  Medications Ordered in UC Medications - No data to display  Initial Impression / Assessment and Plan / UC Course  I have reviewed the triage vital signs and the nursing notes.  Pertinent labs & imaging results that were available during my care of the patient were reviewed by me and considered in my medical decision making (see chart for details).  Clinical impression: 1.  Upper respiratory infection x2 days. 2.  Cough which is worse than her chronic cough from her tobacco use disorder 3.  Tobacco use disorder 4.  Nasal congestion 5.  Slight wheeze on examination  Treatment plan: 1.  The findings and treatment plan were discussed in detail with the patient.  Patient was in agreement. 2.  We will go ahead and do a COVID test.  She does not have a fever so we will hold on doing an influenza test.  We will send that to the hospital.  Advised her to follow along with the app on her phone called MyChart.  Someone will contact her if she is positive. 3.  She did not need a work note as she is not working today. 4.  Sent in a prescription for albuterol, cough medicine, and Flonase for her symptoms. 5.  Plenty of rest, plenty fluids, Tylenol or Motrin for any fever or discomfort. 6.  If symptoms persist she should see her primary care provider. 7.  I counseled her on getting her INR checked more frequently as she is on Coumadin.  She is a little bit confused from her stroke and I do not see any recent values.  She voiced verbal understanding and will contact her PCP upon leaving the office today. 8.  If her symptoms worsen she needs to go to the ER. 9.  She was stable upon discharge and will  follow-up here as needed. 10.  Counseled her on smoking cessation for greater than 15 minutes of an office visit that encompassed the above for greater than 35 minutes.    Final Clinical Impressions(s) / UC Diagnoses   Final diagnoses:  Upper respiratory tract infection, unspecified type  Cough  Wheeze  Nasal congestion  Tobacco abuse disorder     Discharge Instructions      As we discussed, you have a upper respiratory infection.  I sent in a prescription for an inhaler, a cough medicine, and a nasal spray to help with your nasal congestion.  Please use as directed. Your COVID test is pending at the time of discharge and someone will contact you with a positive result.  You can follow along with the app on your phone called MyChart.  Instructions are on your discharge summary. Tylenol or Motrin for any fever or discomfort. Plenty of rest and plenty of fluids. I did not give you a work note as you said you are not working today. If your symptoms persist you need to see your primary care provider. If they worsen then please go to the ER. As we discussed you are on Coumadin and I am not seeing a recent lab value monitoring your levels.  Please discuss that with your primary care provider.  This is something that you need to have checked on a regular basis.     ED Prescriptions     Medication Sig Dispense Auth. Provider   albuterol (VENTOLIN HFA) 108 (90 Base) MCG/ACT inhaler Inhale 1-2 puffs into the lungs every 6 (six) hours as needed for wheezing or shortness of breath. 1 each Verda Cumins, MD   promethazine-dextromethorphan (PROMETHAZINE-DM) 6.25-15 MG/5ML syrup Take 5 mLs by mouth 4 (four) times daily as needed for cough. 180 mL Verda Cumins, MD   fluticasone [facility]) 50 MCG/ACT nasal spray Place 2 sprays into both nostrils daily. 15.8 mL Verda Cumins, MD      PDMP not reviewed this encounter.   Verda Cumins, MD 06/10/21 [phone]

## 2021-07-03 ENCOUNTER — Inpatient Hospital Stay: Payer: Medicare Other | Attending: Oncology | Admitting: Oncology

## 2021-07-03 ENCOUNTER — Other Ambulatory Visit: Payer: Self-pay

## 2021-07-03 ENCOUNTER — Encounter: Payer: Self-pay | Admitting: Oncology

## 2021-07-03 ENCOUNTER — Inpatient Hospital Stay: Payer: Medicare Other

## 2021-07-03 VITALS — BP 94/72 | HR 88 | Temp 99.2°F | Resp 18 | Wt 112.4 lb

## 2021-07-03 DIAGNOSIS — F17210 Nicotine dependence, cigarettes, uncomplicated: Secondary | ICD-10-CM | POA: Diagnosis not present

## 2021-07-03 DIAGNOSIS — I252 Old myocardial infarction: Secondary | ICD-10-CM | POA: Diagnosis not present

## 2021-07-03 DIAGNOSIS — K7030 Alcoholic cirrhosis of liver without ascites: Secondary | ICD-10-CM

## 2021-07-03 DIAGNOSIS — C189 Malignant neoplasm of colon, unspecified: Secondary | ICD-10-CM

## 2021-07-03 DIAGNOSIS — C787 Secondary malignant neoplasm of liver and intrahepatic bile duct: Secondary | ICD-10-CM | POA: Insufficient documentation

## 2021-07-03 DIAGNOSIS — Z803 Family history of malignant neoplasm of breast: Secondary | ICD-10-CM | POA: Insufficient documentation

## 2021-07-03 DIAGNOSIS — Z515 Encounter for palliative care: Secondary | ICD-10-CM | POA: Diagnosis not present

## 2021-07-03 DIAGNOSIS — Z7901 Long term (current) use of anticoagulants: Secondary | ICD-10-CM | POA: Diagnosis not present

## 2021-07-03 DIAGNOSIS — K7469 Other cirrhosis of liver: Secondary | ICD-10-CM

## 2021-07-03 DIAGNOSIS — Z8673 Personal history of transient ischemic attack (TIA), and cerebral infarction without residual deficits: Secondary | ICD-10-CM | POA: Insufficient documentation

## 2021-07-03 DIAGNOSIS — Z7189 Other specified counseling: Secondary | ICD-10-CM | POA: Insufficient documentation

## 2021-07-03 DIAGNOSIS — K7460 Unspecified cirrhosis of liver: Secondary | ICD-10-CM | POA: Diagnosis not present

## 2021-07-03 DIAGNOSIS — K639 Disease of intestine, unspecified: Secondary | ICD-10-CM | POA: Insufficient documentation

## 2021-07-03 DIAGNOSIS — C182 Malignant neoplasm of ascending colon: Secondary | ICD-10-CM | POA: Insufficient documentation

## 2021-07-03 LAB — CBC WITH DIFFERENTIAL/PLATELET
Abs Immature Granulocytes: 0.07 10*3/uL (ref 0.00–0.07)
Basophils Absolute: 0.1 10*3/uL (ref 0.0–0.1)
Basophils Relative: 0 %
Eosinophils Absolute: 0.1 10*3/uL (ref 0.0–0.5)
Eosinophils Relative: 1 %
HCT: 38 % (ref 36.0–46.0)
Hemoglobin: 12 g/dL (ref 12.0–15.0)
Immature Granulocytes: 1 %
Lymphocytes Relative: 9 %
Lymphs Abs: 1.1 10*3/uL (ref 0.7–4.0)
MCH: 24.4 pg — ABNORMAL LOW (ref 26.0–34.0)
MCHC: 31.6 g/dL (ref 30.0–36.0)
MCV: 77.4 fL — ABNORMAL LOW (ref 80.0–100.0)
Monocytes Absolute: 0.9 10*3/uL (ref 0.1–1.0)
Monocytes Relative: 7 %
Neutro Abs: 10 10*3/uL — ABNORMAL HIGH (ref 1.7–7.7)
Neutrophils Relative %: 82 %
Platelets: 297 10*3/uL (ref 150–400)
RBC: 4.91 MIL/uL (ref 3.87–5.11)
RDW: 18.4 % — ABNORMAL HIGH (ref 11.5–15.5)
WBC: 12.1 10*3/uL — ABNORMAL HIGH (ref 4.0–10.5)
nRBC: 0 % (ref 0.0–0.2)

## 2021-07-03 LAB — COMPREHENSIVE METABOLIC PANEL
ALT: 15 U/L (ref 0–44)
AST: 22 U/L (ref 15–41)
Albumin: 2.9 g/dL — ABNORMAL LOW (ref 3.5–5.0)
Alkaline Phosphatase: 402 U/L — ABNORMAL HIGH (ref 38–126)
Anion gap: 6 (ref 5–15)
BUN: 15 mg/dL (ref 8–23)
CO2: 29 mmol/L (ref 22–32)
Calcium: 8.8 mg/dL — ABNORMAL LOW (ref 8.9–10.3)
Chloride: 102 mmol/L (ref 98–111)
Creatinine, Ser: 1 mg/dL (ref 0.44–1.00)
GFR, Estimated: >60 mL/min (ref >60)
Glucose, Bld: 96 mg/dL (ref 70–99)
Potassium: 4.5 mmol/L (ref 3.5–5.1)
Sodium: 137 mmol/L (ref 135–145)
Total Bilirubin: 0.8 mg/dL (ref 0.3–1.2)
Total Protein: 6.4 g/dL — ABNORMAL LOW (ref 6.5–8.1)

## 2021-07-03 NOTE — Progress Notes (Signed)
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 

## 2021-07-03 NOTE — H&P (View-Only) (Signed)
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   June 2022.  Patient presented to primary care provider for evaluation of unintentional weight loss over the past 3 months.   04/30/2021, CT chest was obtained. CT shortness of breath ill-defined irregular hypoattenuated and hypoattenuated adrenal masses on the background of heterogeneous attenuation of the liver parenchyma. 5.5 cm soft tissue attenuation within the liver hilum.  Partially visualized linear metallic density within the soft tissue density at the hilum of the liver.  Splenomegaly, aortic atherosclerosis. Soft tissue/bronchoscopy nodules within the right lung.  Patient establish care with [facility] 05/23/2021. 06/06/2021 patient underwent MRI abdomen with and without contrast. Redemonstrated large peripheral arterial phase hyperenhancing mass in the right hepatic lobe.  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 Partially imaged large cecal mass seen.  06/24/2021, patient underwent ultrasound liver biopsy Pathology showed metastatic adenocarcinoma, consistent with spread from a colon primary.  Patient prefers to have oncology care closer to her home and was referred to establish care. Patient is a current everyday smoker.  She has a history of alcohol use.  Patient denies any regular alcohol use currently.  She drinks socially.  Denies nausea, vomiting, blood in the stool.  She  lives with her sister [name] who accompanied patient to today's visit.  Appetite is poor.  She has some right sided abdominal pressure discomfort.  History of stroke 30 years ago with residual right-sided weakness.  She takes Xarelto 10 mg daily.  She is able to ambulate independently.   History of MI several years ago.  Patient has a son who lives out of state.  Per patient and her sister, son is not much involved in patient's current life.  Review of Systems  Constitutional:  Positive for appetite change and fatigue. Negative for chills and fever.  HENT:   Negative for hearing loss and voice change.   Eyes:  Negative for eye problems.  Respiratory:  Negative for chest tightness and cough.   Cardiovascular:  Negative for chest pain.  Gastrointestinal:  Negative for abdominal distention and blood in stool.       RUQ pressure  Endocrine: Negative for hot flashes.  Genitourinary:  Negative for difficulty urinating and frequency.   Musculoskeletal:  Negative for arthralgias.  Skin:  Negative for itching and rash.  Neurological:  Negative for extremity weakness.       Chronic right side weakness due to previous stroke  Hematological:  Negative for adenopathy.  Psychiatric/Behavioral:  Negative for confusion.    MEDICAL HISTORY:  Past Medical History:  Diagnosis Date   Heart attack ([place])    Heart disease    heart attack - no stents   High cholesterol    Hx of blood clots    legs   Hypertension    Kidney disease    Stroke [facility])     SURGICAL HISTORY: Past Surgical History:  Procedure Laterality Date   APPENDECTOMY     LIVER BIOPSY  SOCIAL HISTORY: Social History   Socioeconomic History   Marital status: Divorced    Spouse name: Not on file   Number of children: Not on file   Years of education: Not on file   Highest education level: Not on file  Occupational History   Not on file  Tobacco Use   Smoking status: Every Day    Packs/day: 1.00    Years: 40.00    Pack  years: 40.00    Types: Cigarettes   Smokeless tobacco: Never  Vaping Use   Vaping Use: Never used  Substance and Sexual Activity   Alcohol use: Not Currently    Comment: rarely   Drug use: No   Sexual activity: Not on file  Other Topics Concern   Not on file  Social History Narrative   Not on file   Social Determinants of Health   Financial Resource Strain: Not on file  Food Insecurity: Not on file  Transportation Needs: Not on file  Physical Activity: Not on file  Stress: Not on file  Social Connections: Not on file  Intimate Partner Violence: Not on file    FAMILY HISTORY: Family History  Problem Relation Age of Onset   Other Mother        "old age"   Alzheimer's disease Mother    Heart attack Father    Breast cancer Paternal Aunt     ALLERGIES:  is allergic to penicillins.  MEDICATIONS:  Current Outpatient Medications  Medication Sig Dispense Refill   acetaminophen (TYLENOL) 500 MG tablet Take by mouth.     albuterol (VENTOLIN HFA) 108 (90 Base) MCG/ACT inhaler Inhale 1-2 puffs into the lungs every 6 (six) hours as needed for wheezing or shortness of breath. 1 each 0   cyclobenzaprine (FLEXERIL) 10 MG tablet Take 1 tablet (10 mg total) by mouth 3 (three) times daily as needed for muscle spasms. 30 tablet 0   fluticasone (FLONASE) 50 MCG/ACT nasal spray Place 2 sprays into both nostrils daily. 15.8 mL 0   metoprolol succinate (TOPROL-XL) 25 MG 24 hr tablet Take 25 mg by mouth daily.     rivaroxaban (XARELTO) 10 MG TABS tablet Take 1 tablet by mouth daily with breakfast.     rosuvastatin (CRESTOR) 20 MG tablet Take 20 mg by mouth at bedtime.     diazepam (VALIUM) 5 MG tablet Take by mouth. (Patient not taking: Reported on 07/03/2021)     gabapentin (NEURONTIN) 100 MG capsule Take by mouth. (Patient not taking: Reported on 07/03/2021)     promethazine-dextromethorphan (PROMETHAZINE-DM) 6.25-15 MG/5ML syrup Take 5 mLs by mouth 4 (four) times daily as needed for cough.  (Patient not taking: Reported on 07/03/2021) 180 mL 0   traMADol (ULTRAM) 50 MG tablet Take 1 tablet (50 mg total) by mouth every 8 (eight) hours as needed for moderate pain or severe pain. (Patient not taking: Reported on 07/03/2021) 15 tablet 0   No current facility-administered medications for this visit.     PHYSICAL EXAMINATION: ECOG PERFORMANCE STATUS: 1 - Symptomatic but completely ambulatory Vitals:   07/03/21 1117  BP: 94/72  Pulse: 88  Resp: 18  Temp: 99.2 F (37.3 C)   Filed Weights   07/03/21 1117  Weight: 112 lb 7 oz (51 kg)    Physical Exam Constitutional:      General: She is not in acute distress.    Comments: Thin, ambulates independantly  HENT:     Head: Normocephalic and atraumatic.  Eyes:  General: No scleral icterus. Cardiovascular:     Rate and Rhythm: Normal rate and regular rhythm.     Heart sounds: Normal heart sounds.  Pulmonary:     Effort: Pulmonary effort is normal. No respiratory distress.     Breath sounds: No wheezing.  Abdominal:     General: Bowel sounds are normal. There is no distension.     Palpations: Abdomen is soft.  Musculoskeletal:        General: No deformity. Normal range of motion.     Cervical back: Normal range of motion and neck supple.  Skin:    General: Skin is warm and dry.     Findings: No erythema or rash.  Neurological:     Mental Status: She is alert and oriented to person, place, and time. Mental status is at baseline.     Cranial Nerves: No cranial nerve deficit.     Coordination: Coordination normal.     Comments: Slow talking  Psychiatric:        Mood and Affect: Mood normal.    LABORATORY DATA:  I have reviewed the data as listed Lab Results  Component Value Date   WBC 12.1 (H) 07/03/2021   HGB 12.0 07/03/2021   HCT 38.0 07/03/2021   MCV 77.4 (L) 07/03/2021   PLT 297 07/03/2021   Recent Labs    07/03/21 1202  NA 137  K 4.5  CL 102  CO2 29  GLUCOSE 96  BUN 15  CREATININE 1.00  CALCIUM  8.8*  GFRNONAA >60  PROT 6.4*  ALBUMIN 2.9*  AST 22  ALT 15  ALKPHOS 402*  BILITOT 0.8   Iron/TIBC/Ferritin/ %Sat No results found for: IRON, TIBC, FERRITIN, IRONPCTSAT    RADIOGRAPHIC STUDIES: I have personally reviewed the radiological images as listed and agreed with the findings in the report. No results found.    ASSESSMENT & PLAN:  1. Malignant neoplasm of ascending colon ([employer])   2. Goals of care, counseling/discussion    #Stage IV right-sided colon cancer with liver metastasis.  Pathology was reviewed and discussed with patient.Images were independently reviewed by me and discussed with patient.  Will obtain MRI images from [facility] to be uploaded to our EMR. Check CBC, CMP, CEA. Discussed with patient that patient has stage IV colon cancer which is not a curable condition.  Discussed about the rationale and potential side effects of palliative chemotherapy with FOLFOX +/- bevacizumab. Will send [facility] one testing.   The diagnosis and care plan were discussed with patient in detail.  The goal of treatment which is to palliate disease, disease related symptoms, improve quality of life and hopefully prolong life was highlighted in our discussion.  Chemotherapy education was provided.  We had discussed the composition of chemotherapy regimen, length of chemo cycle, duration of treatment and the time to assess response to treatment.    I explained to the patient the risks and benefits of chemotherapy including all but not limited to hair loss, mouth sore, nausea, vomiting, worsening of liver function, diarrhea, low blood counts, bleeding, neuropathy and risk of life threatening infection and even death, secondary malignancy etc.  Patient voices understanding and willing to proceed chemotherapy.  Refer to palliative care service  # Chemotherapy education; refer to vascular surgery for Medi- port placement. Antiemetics-Zofran and Compazine; EMLA cream sent to pharmacy  # large  cecal mass, no obstruction symptoms yet.  Tissue diagnosis has been established already.  colonoscopy wont not change management. Will hold off for  now.  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  Patient has had negative hepatitis panel in 2018 at  [facility]. Check hepatitis panel, PT, PTT. Child Pugh A - 6 point.  Refer to GI  # History of MI [ age of 73] and Stroke [ age of 81], on Xarelto '10mg'$  daily. Last Echo per PCP note was in 2011, 50% LVEF, result is not available in current EMR. Check 2D echo.  Supportive care measures are necessary for patient well-being and will be provided as necessary. We spent sufficient time to discuss many aspect of care, questions were answered to patient's satisfaction.    Orders Placed This Encounter  Procedures   CBC with Differential/Platelet    Standing Status:   Future    Number of Occurrences:   1    Standing Expiration Date:   07/03/2022   Comprehensive metabolic panel    Standing Status:   Future    Number of Occurrences:   1    Standing Expiration Date:   07/03/2022   CEA    Standing Status:   Future    Number of Occurrences:   1    Standing Expiration Date:   07/03/2022   Ambulatory Referral to Palliative Care    Referral Priority:   Routine    Referral Type:   Consultation    Number of Visits Requested:   1    All questions were answered. The patient knows to call the clinic with any problems questions or concerns.  cc Callie Fielding, NP    Return of visit: hopefully to start treatment in 1-2 weeks.  Thank you for this kind referral and the opportunity to participate in the care of this patient. A copy of today's note is routed to referring provider    Earlie Server, MD, PhD Hematology Oncology [facility] at [facility] Pager- [phone] 07/03/2021

## 2021-07-03 NOTE — Progress Notes (Signed)
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   June 2022.  Patient presented to primary care provider for evaluation of unintentional weight loss over the past 3 months.   04/30/2021, CT chest was obtained. CT shortness of breath ill-defined irregular hypoattenuated and hypoattenuated adrenal masses on the background of heterogeneous attenuation of the liver parenchyma. 5.5 cm soft tissue attenuation within the liver hilum.  Partially visualized linear metallic density within the soft tissue density at the hilum of the liver.  Splenomegaly, aortic atherosclerosis. Soft tissue/bronchoscopy nodules within the right lung.  Patient establish care with [facility] 05/23/2021. 06/06/2021 patient underwent MRI abdomen with and without contrast. Redemonstrated large peripheral arterial phase hyperenhancing mass in the right hepatic lobe.  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 Partially imaged large cecal mass seen.  06/24/2021, patient underwent ultrasound liver biopsy Pathology showed metastatic adenocarcinoma, consistent with spread from a colon primary.  Patient prefers to have oncology care closer to her home and was referred to establish care. Patient is a current everyday smoker.  She has a history of alcohol use.  Patient denies any regular alcohol use currently.  She drinks socially.  Denies nausea, vomiting, blood in the stool.  She  lives with her sister [name] who accompanied patient to today's visit.  Appetite is poor.  She has some right sided abdominal pressure discomfort.  History of stroke 30 years ago with residual right-sided weakness.  She takes Xarelto 10 mg daily.  She is able to ambulate independently.   History of MI several years ago.  Patient has a son who lives out of state.  Per patient and her sister, son is not much involved in patient's current life.  Review of Systems  Constitutional:  Positive for appetite change and fatigue. Negative for chills and fever.  HENT:   Negative for hearing loss and voice change.   Eyes:  Negative for eye problems.  Respiratory:  Negative for chest tightness and cough.   Cardiovascular:  Negative for chest pain.  Gastrointestinal:  Negative for abdominal distention and blood in stool.       RUQ pressure  Endocrine: Negative for hot flashes.  Genitourinary:  Negative for difficulty urinating and frequency.   Musculoskeletal:  Negative for arthralgias.  Skin:  Negative for itching and rash.  Neurological:  Negative for extremity weakness.       Chronic right side weakness due to previous stroke  Hematological:  Negative for adenopathy.  Psychiatric/Behavioral:  Negative for confusion.    MEDICAL HISTORY:  Past Medical History:  Diagnosis Date   Heart attack ([place])    Heart disease    heart attack - no stents   High cholesterol    Hx of blood clots    legs   Hypertension    Kidney disease    Stroke [facility])     SURGICAL HISTORY: Past Surgical History:  Procedure Laterality Date   APPENDECTOMY     LIVER BIOPSY  SOCIAL HISTORY: Social History   Socioeconomic History   Marital status: Divorced    Spouse name: Not on file   Number of children: Not on file   Years of education: Not on file   Highest education level: Not on file  Occupational History   Not on file  Tobacco Use   Smoking status: Every Day    Packs/day: 1.00    Years: 40.00    Pack  years: 40.00    Types: Cigarettes   Smokeless tobacco: Never  Vaping Use   Vaping Use: Never used  Substance and Sexual Activity   Alcohol use: Not Currently    Comment: rarely   Drug use: No   Sexual activity: Not on file  Other Topics Concern   Not on file  Social History Narrative   Not on file   Social Determinants of Health   Financial Resource Strain: Not on file  Food Insecurity: Not on file  Transportation Needs: Not on file  Physical Activity: Not on file  Stress: Not on file  Social Connections: Not on file  Intimate Partner Violence: Not on file    FAMILY HISTORY: Family History  Problem Relation Age of Onset   Other Mother        "old age"   Alzheimer's disease Mother    Heart attack Father    Breast cancer Paternal Aunt     ALLERGIES:  is allergic to penicillins.  MEDICATIONS:  Current Outpatient Medications  Medication Sig Dispense Refill   acetaminophen (TYLENOL) 500 MG tablet Take by mouth.     albuterol (VENTOLIN HFA) 108 (90 Base) MCG/ACT inhaler Inhale 1-2 puffs into the lungs every 6 (six) hours as needed for wheezing or shortness of breath. 1 each 0   cyclobenzaprine (FLEXERIL) 10 MG tablet Take 1 tablet (10 mg total) by mouth 3 (three) times daily as needed for muscle spasms. 30 tablet 0   fluticasone (FLONASE) 50 MCG/ACT nasal spray Place 2 sprays into both nostrils daily. 15.8 mL 0   metoprolol succinate (TOPROL-XL) 25 MG 24 hr tablet Take 25 mg by mouth daily.     rivaroxaban (XARELTO) 10 MG TABS tablet Take 1 tablet by mouth daily with breakfast.     rosuvastatin (CRESTOR) 20 MG tablet Take 20 mg by mouth at bedtime.     diazepam (VALIUM) 5 MG tablet Take by mouth. (Patient not taking: Reported on 07/03/2021)     gabapentin (NEURONTIN) 100 MG capsule Take by mouth. (Patient not taking: Reported on 07/03/2021)     promethazine-dextromethorphan (PROMETHAZINE-DM) 6.25-15 MG/5ML syrup Take 5 mLs by mouth 4 (four) times daily as needed for cough.  (Patient not taking: Reported on 07/03/2021) 180 mL 0   traMADol (ULTRAM) 50 MG tablet Take 1 tablet (50 mg total) by mouth every 8 (eight) hours as needed for moderate pain or severe pain. (Patient not taking: Reported on 07/03/2021) 15 tablet 0   No current facility-administered medications for this visit.     PHYSICAL EXAMINATION: ECOG PERFORMANCE STATUS: 1 - Symptomatic but completely ambulatory Vitals:   07/03/21 1117  BP: 94/72  Pulse: 88  Resp: 18  Temp: 99.2 F (37.3 C)   Filed Weights   07/03/21 1117  Weight: 112 lb 7 oz (51 kg)    Physical Exam Constitutional:      General: She is not in acute distress.    Comments: Thin, ambulates independantly  HENT:     Head: Normocephalic and atraumatic.  Eyes:  General: No scleral icterus. Cardiovascular:     Rate and Rhythm: Normal rate and regular rhythm.     Heart sounds: Normal heart sounds.  Pulmonary:     Effort: Pulmonary effort is normal. No respiratory distress.     Breath sounds: No wheezing.  Abdominal:     General: Bowel sounds are normal. There is no distension.     Palpations: Abdomen is soft.  Musculoskeletal:        General: No deformity. Normal range of motion.     Cervical back: Normal range of motion and neck supple.  Skin:    General: Skin is warm and dry.     Findings: No erythema or rash.  Neurological:     Mental Status: She is alert and oriented to person, place, and time. Mental status is at baseline.     Cranial Nerves: No cranial nerve deficit.     Coordination: Coordination normal.     Comments: Slow talking  Psychiatric:        Mood and Affect: Mood normal.    LABORATORY DATA:  I have reviewed the data as listed Lab Results  Component Value Date   WBC 12.1 (H) 07/03/2021   HGB 12.0 07/03/2021   HCT 38.0 07/03/2021   MCV 77.4 (L) 07/03/2021   PLT 297 07/03/2021   Recent Labs    07/03/21 1202  NA 137  K 4.5  CL 102  CO2 29  GLUCOSE 96  BUN 15  CREATININE 1.00  CALCIUM  8.8*  GFRNONAA >60  PROT 6.4*  ALBUMIN 2.9*  AST 22  ALT 15  ALKPHOS 402*  BILITOT 0.8   Iron/TIBC/Ferritin/ %Sat No results found for: IRON, TIBC, FERRITIN, IRONPCTSAT    RADIOGRAPHIC STUDIES: I have personally reviewed the radiological images as listed and agreed with the findings in the report. No results found.    ASSESSMENT & PLAN:  1. Malignant neoplasm of ascending colon ([place])   2. Goals of care, counseling/discussion    #Stage IV right-sided colon cancer with liver metastasis.  Pathology was reviewed and discussed with patient.Images were independently reviewed by me and discussed with patient.  Will obtain MRI images from [facility] to be uploaded to our EMR. Check CBC, CMP, CEA. Discussed with patient that patient has stage IV colon cancer which is not a curable condition.  Discussed about the rationale and potential side effects of palliative chemotherapy with FOLFOX +/- bevacizumab. Will send [facility] one testing.   The diagnosis and care plan were discussed with patient in detail.  The goal of treatment which is to palliate disease, disease related symptoms, improve quality of life and hopefully prolong life was highlighted in our discussion.  Chemotherapy education was provided.  We had discussed the composition of chemotherapy regimen, length of chemo cycle, duration of treatment and the time to assess response to treatment.    I explained to the patient the risks and benefits of chemotherapy including all but not limited to hair loss, mouth sore, nausea, vomiting, worsening of liver function, diarrhea, low blood counts, bleeding, neuropathy and risk of life threatening infection and even death, secondary malignancy etc.  Patient voices understanding and willing to proceed chemotherapy.  Refer to palliative care service  # Chemotherapy education; refer to vascular surgery for Medi- port placement. Antiemetics-Zofran and Compazine; EMLA cream sent to pharmacy  # large  cecal mass, no obstruction symptoms yet.  Tissue diagnosis has been established already.  colonoscopy wont not change management. Will hold off for  now.  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  Patient has had negative hepatitis panel in 2018 at  [facility]. Check hepatitis panel, PT, PTT. Child Pugh A - 6 point.  Refer to GI  # History of MI [ age of 8] and Stroke [ age of 52], on Xarelto '10mg'$  daily. Last Echo per PCP note was in 2011, 50% LVEF, result is not available in current EMR. Check 2D echo.  Supportive care measures are necessary for patient well-being and will be provided as necessary. We spent sufficient time to discuss many aspect of care, questions were answered to patient's satisfaction.    Orders Placed This Encounter  Procedures   CBC with Differential/Platelet    Standing Status:   Future    Number of Occurrences:   1    Standing Expiration Date:   07/03/2022   Comprehensive metabolic panel    Standing Status:   Future    Number of Occurrences:   1    Standing Expiration Date:   07/03/2022   CEA    Standing Status:   Future    Number of Occurrences:   1    Standing Expiration Date:   07/03/2022   Ambulatory Referral to Palliative Care    Referral Priority:   Routine    Referral Type:   Consultation    Number of Visits Requested:   1    All questions were answered. The patient knows to call the clinic with any problems questions or concerns.  cc Callie Fielding, NP    Return of visit: hopefully to start treatment in 1-2 weeks.  Thank you for this kind referral and the opportunity to participate in the care of this patient. A copy of today's note is routed to referring provider    Earlie Server, MD, PhD Hematology Oncology [facility] at [facility] Pager- [phone] 07/03/2021

## 2021-07-03 NOTE — Progress Notes (Signed)
Pt here to establish care. Pt reports that she has multiple paternal family members with history of cancer but unable to specify what cancer.

## 2021-07-04 ENCOUNTER — Other Ambulatory Visit: Payer: Self-pay

## 2021-07-04 ENCOUNTER — Encounter: Payer: Self-pay | Admitting: Oncology

## 2021-07-04 ENCOUNTER — Telehealth: Payer: Self-pay

## 2021-07-04 DIAGNOSIS — I252 Old myocardial infarction: Secondary | ICD-10-CM | POA: Insufficient documentation

## 2021-07-04 DIAGNOSIS — C787 Secondary malignant neoplasm of liver and intrahepatic bile duct: Secondary | ICD-10-CM | POA: Insufficient documentation

## 2021-07-04 DIAGNOSIS — C182 Malignant neoplasm of ascending colon: Secondary | ICD-10-CM

## 2021-07-04 DIAGNOSIS — K7030 Alcoholic cirrhosis of liver without ascites: Secondary | ICD-10-CM | POA: Insufficient documentation

## 2021-07-04 LAB — CEA: CEA: 7.1 ng/mL — ABNORMAL HIGH (ref 0.0–4.7)

## 2021-07-04 MED ORDER — ONDANSETRON HCL 8 MG PO TABS: 8.0000 mg | ORAL_TABLET | Freq: Two times a day (BID) | ORAL | 1 refills | Status: DC | PRN

## 2021-07-04 MED ORDER — PROCHLORPERAZINE MALEATE 10 MG PO TABS: 10.0000 mg | ORAL_TABLET | Freq: Four times a day (QID) | ORAL | 1 refills | Status: DC | PRN

## 2021-07-04 MED ORDER — LIDOCAINE-PRILOCAINE 2.5-2.5 % EX CREA: TOPICAL_CREAM | CUTANEOUS | 3 refills | Status: DC

## 2021-07-04 NOTE — Telephone Encounter (Addendum)
Pt scheduled for chemo edu on 8/22. Port referral has been sent to Dr. Bunnie Domino office.  MRI abdomen images from 06/06/21 have been requested from [facility].   Per, MD: Pt will also need echo- as long as she can get an echo done before or 1-2 weeks after she starts, that's fine. history of MI, I just need to get an idea in case we need to give her IVF bolus

## 2021-07-04 NOTE — Telephone Encounter (Signed)
-----   Message from Earlie Server, MD sent at 07/04/2021  2:30 PM EDT ----- After she gets a medi port [place] surgery],  and chemo education FOLFOX  Emla and antiemetics have been sent.  Please send Foundation one testing of her liver biopsy done in [place] on 8/9.  Also need MRI abdomen image done in [facility] to be uploaded in our [place].   Let her know that I discussed with GI, a colonoscopy will not change our management, so she does not need it for now. For her cirrhosis, I recommend her to establish care with GI Dr.Anna. if she agrees, please refer.   Please schedule her to see me lab md 5-FU with D3 pump dc.  Plan to start with 5 FU first, if she tolerates will add OX  in the future.

## 2021-07-04 NOTE — Telephone Encounter (Signed)
FoundationOne tesing request has been submitted online.   Liver specimen  [id number] collected at [facility] on 06/24/21

## 2021-07-07 ENCOUNTER — Inpatient Hospital Stay: Payer: Medicare Other

## 2021-07-07 ENCOUNTER — Inpatient Hospital Stay (HOSPITAL_BASED_OUTPATIENT_CLINIC_OR_DEPARTMENT_OTHER): Payer: Medicare Other | Admitting: Hospice and Palliative Medicine

## 2021-07-07 ENCOUNTER — Telehealth: Payer: Self-pay

## 2021-07-07 ENCOUNTER — Ambulatory Visit
Admission: RE | Admit: 2021-07-07 | Discharge: 2021-07-07 | Disposition: A | Discharge status: Home / Self Care | Payer: Self-pay | Source: Ambulatory Visit | Attending: Oncology | Admitting: Oncology

## 2021-07-07 VITALS — BP 99/68 | HR 98 | Temp 97.7°F | Resp 18

## 2021-07-07 DIAGNOSIS — Z515 Encounter for palliative care: Secondary | ICD-10-CM

## 2021-07-07 DIAGNOSIS — C182 Malignant neoplasm of ascending colon: Secondary | ICD-10-CM

## 2021-07-07 DIAGNOSIS — K7030 Alcoholic cirrhosis of liver without ascites: Secondary | ICD-10-CM

## 2021-07-07 NOTE — Progress Notes (Signed)
Initial palliative care visit.

## 2021-07-07 NOTE — Telephone Encounter (Signed)
-----   Message from Earlie Server, MD sent at 07/07/2021  8:56 AM EDT -----  ----- Message ----- From: Earlie Server, MD Sent: 07/04/2021   2:40 PM EDT To: Evelina Dun, RN  After she gets a Licensed conveyancer surgery],  and chemo education FOLFOX  Emla and antiemetics have been sent.  Please send Foundation one testing of her liver biopsy done in [place] on 8/9.  Also need MRI abdomen image done in [facility] to be uploaded in our [place].   Let her know that I discussed with GI, a colonoscopy will not change our management, so she does not need it for now. For her cirrhosis, I recommend her to establish care with GI Dr.Anna. if she agrees, please refer.   Please schedule her to see me lab md 5-FU with D3 pump dc.  Plan to start with 5 FU first, if she tolerates will add OX  in the future.

## 2021-07-07 NOTE — Telephone Encounter (Addendum)
Order entered for outside MRI and request sent to [facility] for image to be uploaded to [facility].  Port placement appt pending at this time.  Referral to GI entered.

## 2021-07-07 NOTE — Progress Notes (Signed)
[place]  [phone] [phone]   Name: Cheryl Baldwin Date: 07/07/2021 MRN: [id number]  DOB: [birth year]  Patient Care Team: [facility] as PCP - General    REASON FOR CONSULTATION: Cheryl Baldwin is a 60 y.o. 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  Palliative care was consulted to help address goals and manage ongoing symptoms.  SOCIAL HISTORY:     reports that she has been smoking cigarettes. She has a 40.00 pack-year smoking history. She has never used smokeless tobacco. She reports that she does not currently use alcohol. She reports that she does not use drugs.  Patient is divorced.  She has a son who is in the [employer] and currently stationed in [place].  Her caregiver is her sister with whom patient currently lives.  ADVANCE DIRECTIVES:  Does not have  CODE STATUS:   PAST MEDICAL HISTORY: Past Medical History:  Diagnosis Date   Heart attack ([place])    Heart disease    heart attack - no stents   High cholesterol    Hx of blood clots    legs   Hypertension    Kidney disease    Stroke [facility])     PAST SURGICAL HISTORY:  Past Surgical History:  Procedure Laterality Date   APPENDECTOMY     LIVER BIOPSY      HEMATOLOGY/ONCOLOGY HISTORY:  Oncology History  Malignant neoplasm of colon ([place])  07/03/2021 Initial Diagnosis   Malignant neoplasm of colon ([place])   07/03/2021 Cancer Staging   Staging form: Colon and Rectum - Neuroendocine Tumors, AJCC 8th Edition - Clinical stage from 07/03/2021: Stage IV (cTX, cNX, cM1) - Signed by Earlie Server, MD on 07/03/2021   07/09/2021 -  Chemotherapy    Patient is on Treatment Plan: COLORECTAL FOLFOX + BEVACIZUMAB Q14D         ALLERGIES:  is allergic to  penicillins.  MEDICATIONS:  Current Outpatient Medications  Medication Sig Dispense Refill   acetaminophen (TYLENOL) 500 MG tablet Take by mouth.     albuterol (VENTOLIN HFA) 108 (90 Base) MCG/ACT inhaler Inhale 1-2 puffs into the lungs every 6 (six) hours as needed for wheezing or shortness of breath. 1 each 0   cyclobenzaprine (FLEXERIL) 10 MG tablet Take 1 tablet (10 mg total) by mouth 3 (three) times daily as needed for muscle spasms. 30 tablet 0   diazepam (VALIUM) 5 MG tablet Take by mouth. (Patient not taking: Reported on 07/03/2021)     fluticasone (FLONASE) 50 MCG/ACT nasal spray Place 2 sprays into both nostrils daily. 15.8 mL 0   gabapentin (NEURONTIN) 100 MG capsule Take by mouth. (Patient not taking: Reported on 07/03/2021)     lidocaine-prilocaine (EMLA) cream Apply to affected area once 30 g 3   metoprolol succinate (TOPROL-XL) 25 MG 24 hr tablet Take 25 mg by mouth daily.     ondansetron (ZOFRAN) 8 MG tablet Take 1 tablet (8 mg total) by mouth 2 (two) times daily as needed for refractory nausea / vomiting. Start on day 3 after chemotherapy. 30 tablet 1   prochlorperazine (COMPAZINE) 10 MG tablet Take 1 tablet (10 mg total) by mouth every 6 (six) hours as needed (Nausea or vomiting). 30 tablet 1   promethazine-dextromethorphan (PROMETHAZINE-DM) 6.25-15 MG/5ML syrup Take 5  mLs by mouth 4 (four) times daily as needed for cough. (Patient not taking: Reported on 07/03/2021) 180 mL 0   rivaroxaban (XARELTO) 10 MG TABS tablet Take 1 tablet by mouth daily with breakfast.     rosuvastatin (CRESTOR) 20 MG tablet Take 20 mg by mouth at bedtime.     traMADol (ULTRAM) 50 MG tablet Take 1 tablet (50 mg total) by mouth every 8 (eight) hours as needed for moderate pain or severe pain. (Patient not taking: Reported on 07/03/2021) 15 tablet 0   No current facility-administered medications for this visit.    VITAL SIGNS: There were no vitals taken for this visit. There were no vitals filed for this  visit.  Estimated body mass index is 17.1 kg/m as calculated from the following:   Height as of 03/05/21: $RemoveBef'5\' 8"'JjcjuazsGv$  (1.727 m).   Weight as of 07/03/21: 112 lb 7 oz (51 kg).  LABS: CBC:    Component Value Date/Time   WBC 12.1 (H) 07/03/2021 1202   HGB 12.0 07/03/2021 1202   HCT 38.0 07/03/2021 1202   PLT 297 07/03/2021 1202   MCV 77.4 (L) 07/03/2021 1202   NEUTROABS 10.0 (H) 07/03/2021 1202   LYMPHSABS 1.1 07/03/2021 1202   MONOABS 0.9 07/03/2021 1202   EOSABS 0.1 07/03/2021 1202   BASOSABS 0.1 07/03/2021 1202   Comprehensive Metabolic Panel:    Component Value Date/Time   NA 137 07/03/2021 1202   K 4.5 07/03/2021 1202   CL 102 07/03/2021 1202   CO2 29 07/03/2021 1202   BUN 15 07/03/2021 1202   CREATININE 1.00 07/03/2021 1202   GLUCOSE 96 07/03/2021 1202   CALCIUM 8.8 (L) 07/03/2021 1202   AST 22 07/03/2021 1202   ALT 15 07/03/2021 1202   ALKPHOS 402 (H) 07/03/2021 1202   BILITOT 0.8 07/03/2021 1202   PROT 6.4 (L) 07/03/2021 1202   ALBUMIN 2.9 (L) 07/03/2021 1202    RADIOGRAPHIC STUDIES: No results found.  PERFORMANCE STATUS (ECOG) : 1 - Symptomatic but completely ambulatory  Review of Systems Unless otherwise noted, a complete review of systems is negative.  Physical Exam General: NAD Pulmonary: Unlabored Extremities: no edema, no joint deformities Skin: no rashes Neurological: Weakness but otherwise nonfocal  IMPRESSION: I met with patient and her sister today in clinic.  I introduced palliative care services and attempted to establish therapeutic rapport.  Patient had chemo education class earlier today.  She is committed to the idea of pursuing chemotherapy and is agreement with current scope of treatment.  Patient denies distressing symptoms today.  She does endorse poor oral intake.  She is drinking oral supplements.  Recommended nutrition consult but patient declined.  Patient has occasional intermittent constipation/diarrhea but denies currently.  Had a  regular bowel movement yesterday.  Discussed bowel regimen in detail.  At baseline, patient is functionally independent with her own care.  She does live at home with her sister who is the primary caregiver given patient's history of CVA/intermittent confusion.  Patient sister is planning to move to [place] in the near future.  Patient says she will likely follow as she does not feel she can live alone.  We discussed advance directives and I sent patient home with ACP documents to complete.  PLAN: -Continue current scope of treatment -ACP documents reviewed -Follow-up MyChart visit 3 to 4 weeks (patient will be getting treatment in [place])   Patient expressed understanding and was in agreement with this plan. She also understands that She can call the clinic  at any time with any questions, concerns, or complaints.     Time Total: 20 minutes  Visit consisted of counseling and education dealing with the complex and emotionally intense issues of symptom management and palliative care in the setting of serious and potentially life-threatening illness.Greater than 50%  of this time was spent counseling and coordinating care related to the above assessment and plan.  Signed by: Altha Harm, PhD, NP-C

## 2021-07-08 ENCOUNTER — Encounter: Payer: Self-pay | Admitting: Oncology

## 2021-07-09 ENCOUNTER — Encounter (INDEPENDENT_AMBULATORY_CARE_PROVIDER_SITE_OTHER): Payer: Self-pay

## 2021-07-09 ENCOUNTER — Telehealth (INDEPENDENT_AMBULATORY_CARE_PROVIDER_SITE_OTHER): Payer: Self-pay

## 2021-07-09 NOTE — Telephone Encounter (Signed)
Patient is schedule for port placement with Dr Lucky Cowboy at [facility] arrival time 6:45 am on 07/14/21. Pre-procedure instructions were gone over with patient sister and letter will be mailed out.

## 2021-07-10 NOTE — Telephone Encounter (Signed)
Images from [place] are now available in [facility]. Foundation one testing request has been sent. Pt scheduled for port placement on 8/29.  Please schedule pt for lab/MD/ 5FU *new* a day or 2 after port placement, with dc pump day 3. Please notify pt of appts.

## 2021-07-11 ENCOUNTER — Encounter: Payer: Self-pay | Admitting: Oncology

## 2021-07-11 NOTE — Telephone Encounter (Signed)
Pt scheduled for new tx on 8/31

## 2021-07-14 ENCOUNTER — Ambulatory Visit
Admission: RE | Admit: 2021-07-14 | Discharge: 2021-07-14 | Disposition: A | Discharge status: Home / Self Care | Payer: Medicare Other | Attending: Vascular Surgery | Admitting: Vascular Surgery

## 2021-07-14 ENCOUNTER — Other Ambulatory Visit: Payer: Self-pay

## 2021-07-14 ENCOUNTER — Encounter: Payer: Self-pay | Admitting: Vascular Surgery

## 2021-07-14 ENCOUNTER — Other Ambulatory Visit (INDEPENDENT_AMBULATORY_CARE_PROVIDER_SITE_OTHER): Payer: Self-pay | Admitting: Nurse Practitioner

## 2021-07-14 ENCOUNTER — Encounter
Admission: RE | Disposition: A | Discharge status: Home / Self Care | Payer: Self-pay | Source: Home / Self Care | Attending: Vascular Surgery

## 2021-07-14 DIAGNOSIS — C189 Malignant neoplasm of colon, unspecified: Secondary | ICD-10-CM | POA: Diagnosis not present

## 2021-07-14 DIAGNOSIS — C787 Secondary malignant neoplasm of liver and intrahepatic bile duct: Secondary | ICD-10-CM | POA: Diagnosis not present

## 2021-07-14 DIAGNOSIS — Z7901 Long term (current) use of anticoagulants: Secondary | ICD-10-CM | POA: Insufficient documentation

## 2021-07-14 DIAGNOSIS — Z880 Allergy status to penicillin: Secondary | ICD-10-CM | POA: Insufficient documentation

## 2021-07-14 DIAGNOSIS — Z79899 Other long term (current) drug therapy: Secondary | ICD-10-CM | POA: Diagnosis not present

## 2021-07-14 DIAGNOSIS — C182 Malignant neoplasm of ascending colon: Secondary | ICD-10-CM | POA: Diagnosis present

## 2021-07-14 DIAGNOSIS — F17210 Nicotine dependence, cigarettes, uncomplicated: Secondary | ICD-10-CM | POA: Diagnosis not present

## 2021-07-14 HISTORY — PX: PORTA CATH INSERTION: CATH118285

## 2021-07-14 SURGERY — PORTA CATH INSERTION
Anesthesia: Moderate Sedation

## 2021-07-14 MED ORDER — MIDAZOLAM HCL 2 MG/ML PO SYRP: 8.0000 mg | ORAL_SOLUTION | Freq: Once | ORAL | Status: DC | PRN

## 2021-07-14 MED ORDER — HYDROMORPHONE HCL 1 MG/ML IJ SOLN: 1.0000 mg | Freq: Once | INTRAMUSCULAR | Status: DC | PRN

## 2021-07-14 MED ORDER — MIDAZOLAM HCL 2 MG/2ML IJ SOLN
INTRAMUSCULAR | Status: DC | PRN
  Administered 2021-07-14: 2 mg via INTRAVENOUS

## 2021-07-14 MED ORDER — MIDAZOLAM HCL 2 MG/2ML IJ SOLN
INTRAMUSCULAR | Status: AC
  Filled 2021-07-14: qty 2

## 2021-07-14 MED ORDER — ONDANSETRON HCL 4 MG/2ML IJ SOLN: 4.0000 mg | Freq: Four times a day (QID) | INTRAMUSCULAR | Status: DC | PRN

## 2021-07-14 MED ORDER — FENTANYL CITRATE (PF) 100 MCG/2ML IJ SOLN
INTRAMUSCULAR | Status: DC | PRN
  Administered 2021-07-14: 50 ug via INTRAVENOUS

## 2021-07-14 MED ORDER — CLINDAMYCIN PHOSPHATE 300 MG/50ML IV SOLN: 300.0000 mg | Freq: Once | INTRAVENOUS | Status: AC

## 2021-07-14 MED ORDER — FAMOTIDINE 20 MG PO TABS: 40.0000 mg | ORAL_TABLET | Freq: Once | ORAL | Status: DC | PRN

## 2021-07-14 MED ORDER — SODIUM CHLORIDE 0.9 % IV SOLN
Freq: Once | INTRAVENOUS | Status: DC
  Filled 2021-07-14: qty 2

## 2021-07-14 MED ORDER — CLINDAMYCIN PHOSPHATE 300 MG/50ML IV SOLN
INTRAVENOUS | Status: AC
  Administered 2021-07-14: 300 mg via INTRAVENOUS
  Filled 2021-07-14: qty 50

## 2021-07-14 MED ORDER — CHLORHEXIDINE GLUCONATE CLOTH 2 % EX PADS
6.0000 | MEDICATED_PAD | Freq: Every day | CUTANEOUS | Status: DC
  Administered 2021-07-14: 6 via TOPICAL

## 2021-07-14 MED ORDER — METHYLPREDNISOLONE SODIUM SUCC 125 MG IJ SOLR: 125.0000 mg | Freq: Once | INTRAMUSCULAR | Status: DC | PRN

## 2021-07-14 MED ORDER — SODIUM CHLORIDE 0.9 % IV SOLN: INTRAVENOUS | Status: DC

## 2021-07-14 MED ORDER — FENTANYL CITRATE PF 50 MCG/ML IJ SOSY
PREFILLED_SYRINGE | INTRAMUSCULAR | Status: AC
  Filled 2021-07-14: qty 1

## 2021-07-14 MED ORDER — DIPHENHYDRAMINE HCL 50 MG/ML IJ SOLN: 50.0000 mg | Freq: Once | INTRAMUSCULAR | Status: DC | PRN

## 2021-07-14 SURGICAL SUPPLY — 11 items
COVER PROBE U/S 5X48 (MISCELLANEOUS) ×2 IMPLANT
COVER SURGICAL LIGHT HANDLE (MISCELLANEOUS) ×2 IMPLANT
DERMABOND ADVANCED (GAUZE/BANDAGES/DRESSINGS) ×1
DERMABOND ADVANCED .7 DNX12 (GAUZE/BANDAGES/DRESSINGS) ×1 IMPLANT
KIT PORT POWER 8FR ISP CVUE (Port) ×2 IMPLANT
PACK ANGIOGRAPHY (CUSTOM PROCEDURE TRAY) ×2 IMPLANT
PENCIL ELECTRO HAND CTR (MISCELLANEOUS) ×2 IMPLANT
SPONGE XRAY 4X4 16PLY STRL (MISCELLANEOUS) ×2 IMPLANT
SUT MNCRL AB 4-0 PS2 18 (SUTURE) ×2 IMPLANT
SUT VIC AB 3-0 SH 27 (SUTURE) ×1
SUT VIC AB 3-0 SH 27X BRD (SUTURE) ×1 IMPLANT

## 2021-07-14 NOTE — Op Note (Signed)
      [facility]       Operative Note  Date: 07/14/2021  Preoperative diagnosis:  1. Colon cancer  Postoperative diagnosis:  Same as above  Procedures: #1. Ultrasound guidance for vascular access to the right internal jugular vein. #2. Fluoroscopic guidance for placement of catheter. #3. Placement of CT compatible Port-A-Cath, right internal jugular vein.  Surgeon: Leotis Pain, MD.   Anesthesia: Local with moderate conscious sedation for approximately 28 minutes using 2 mg of Versed and 50 mcg of Fentanyl  Fluoroscopy time: less than 1 minute  Contrast used: 0  Estimated blood loss: 3 cc  Indication for the procedure:  The patient is a 60 y.o.female with colon cancer.  The patient needs a Port-A-Cath for durable venous access, chemotherapy, lab draws, and CT scans. We are asked to place this. Risks and benefits were discussed and informed consent was obtained.  Description of procedure: The patient was brought to the vascular and interventional radiology suite.  Moderate conscious sedation was administered throughout the procedure during a face to face encounter with the patient with my supervision of the RN administering medicines and monitoring the patient's vital signs, pulse oximetry, telemetry and mental status throughout from the start of the procedure until the patient was taken to the recovery room. The right neck chest and shoulder were sterilely prepped and draped, and a sterile surgical field was created. Ultrasound was used to help visualize a patent right internal jugular vein. This was then accessed under direct ultrasound guidance without difficulty with the Seldinger needle and a permanent image was recorded. A J-wire was placed. After skin nick and dilatation, the peel-away sheath was then placed over the wire. I then anesthetized an area under the clavicle approximately 1-2 fingerbreadths. A transverse incision was created and an inferior pocket was  created with electrocautery and blunt dissection. The port was then brought onto the field, placed into the pocket and secured to the chest wall with 2 Prolene sutures. The catheter was connected to the port and tunneled from the subclavicular incision to the access site. Fluoroscopic guidance was then used to cut the catheter to an appropriate length. The catheter was then placed through the peel-away sheath and the peel-away sheath was removed. The catheter tip was parked in excellent location under fluorocoscopic guidance in the cavoatrial junction. The pocket was then irrigated with antibiotic impregnated saline and the wound was closed with a running 3-0 Vicryl and a 4-0 Monocryl. The access incision was closed with a single 4-0 Monocryl. The Huber needle was used to withdraw blood and flush the port with heparinized saline. Dermabond was then placed as a dressing. The patient tolerated the procedure well and was taken to the recovery room in stable condition.   Leotis Pain 07/14/2021 8:42 AM   This note was created with Dragon Medical transcription system. Any errors in dictation are purely unintentional.

## 2021-07-14 NOTE — Interval H&P Note (Signed)
History and Physical Interval Note:  07/14/2021 8:18 AM  Cheryl Baldwin  has presented today for surgery, with the diagnosis of Porta Cath Placement    Malignant neoplasm colon.  The various methods of treatment have been discussed with the patient and family. After consideration of risks, benefits and other options for treatment, the patient has consented to  Procedure(s): PORTA CATH INSERTION (N/A) as a surgical intervention.  The patient's history has been reviewed, patient examined, no change in status, stable for surgery.  I have reviewed the patient's chart and labs.  Questions were answered to the patient's satisfaction.     Leotis Pain

## 2021-07-16 ENCOUNTER — Inpatient Hospital Stay: Payer: Medicare Other

## 2021-07-16 ENCOUNTER — Inpatient Hospital Stay: Payer: Medicare Other | Admitting: Oncology

## 2021-07-17 ENCOUNTER — Encounter: Payer: Self-pay | Admitting: Oncology

## 2021-07-17 NOTE — Telephone Encounter (Signed)
Pt will start seeing Dr. Janese Banks next week. This [place] one testing has been sent on this pt.

## 2021-07-18 ENCOUNTER — Inpatient Hospital Stay: Payer: Medicare Other

## 2021-07-22 ENCOUNTER — Ambulatory Visit
Admission: RE | Admit: 2021-07-22 | Discharge: 2021-07-22 | Disposition: A | Discharge status: Home / Self Care | Payer: Medicare Other | Source: Ambulatory Visit | Attending: Oncology | Admitting: Oncology

## 2021-07-22 ENCOUNTER — Other Ambulatory Visit: Payer: Self-pay

## 2021-07-22 DIAGNOSIS — Z01810 Encounter for preprocedural cardiovascular examination: Secondary | ICD-10-CM | POA: Insufficient documentation

## 2021-07-22 DIAGNOSIS — I10 Essential (primary) hypertension: Secondary | ICD-10-CM | POA: Insufficient documentation

## 2021-07-22 DIAGNOSIS — Z0189 Encounter for other specified special examinations: Secondary | ICD-10-CM | POA: Diagnosis not present

## 2021-07-22 DIAGNOSIS — I252 Old myocardial infarction: Secondary | ICD-10-CM | POA: Insufficient documentation

## 2021-07-22 LAB — ECHOCARDIOGRAM COMPLETE
AR max vel: 1.56 cm2
AV Area VTI: 1.47 cm2
AV Area mean vel: 1.5 cm2
AV Mean grad: 4 mmHg
AV Peak grad: 6.6 mmHg
Ao pk vel: 1.28 m/s
Area-P 1/2: 7.74 cm2
Calc EF: 51.6 %
MV VTI: 1.91 cm2
S' Lateral: 3.2 cm
Single Plane A2C EF: 53.3 %
Single Plane A4C EF: 47.9 %

## 2021-07-22 NOTE — Progress Notes (Signed)
*  PRELIMINARY RESULTS* Echocardiogram 2D Echocardiogram has been performed.  Cheryl Baldwin 07/22/2021, 10:31 AM

## 2021-07-23 ENCOUNTER — Encounter: Payer: Self-pay | Admitting: Oncology

## 2021-07-23 ENCOUNTER — Inpatient Hospital Stay: Payer: Medicare Other | Attending: Oncology | Admitting: Oncology

## 2021-07-23 ENCOUNTER — Other Ambulatory Visit: Payer: Medicare Other

## 2021-07-23 ENCOUNTER — Inpatient Hospital Stay: Payer: Medicare Other

## 2021-07-23 VITALS — BP 94/79 | HR 98 | Temp 97.0°F | Resp 16 | Wt 111.7 lb

## 2021-07-23 DIAGNOSIS — C182 Malignant neoplasm of ascending colon: Secondary | ICD-10-CM | POA: Diagnosis present

## 2021-07-23 DIAGNOSIS — C787 Secondary malignant neoplasm of liver and intrahepatic bile duct: Secondary | ICD-10-CM | POA: Diagnosis present

## 2021-07-23 DIAGNOSIS — D509 Iron deficiency anemia, unspecified: Secondary | ICD-10-CM

## 2021-07-23 DIAGNOSIS — Z5111 Encounter for antineoplastic chemotherapy: Secondary | ICD-10-CM | POA: Diagnosis present

## 2021-07-23 LAB — CBC WITH DIFFERENTIAL/PLATELET
Abs Immature Granulocytes: 0.04 10*3/uL (ref 0.00–0.07)
Basophils Absolute: 0.1 10*3/uL (ref 0.0–0.1)
Basophils Relative: 1 %
Eosinophils Absolute: 0.3 10*3/uL (ref 0.0–0.5)
Eosinophils Relative: 3 %
HCT: 35.1 % — ABNORMAL LOW (ref 36.0–46.0)
Hemoglobin: 11.2 g/dL — ABNORMAL LOW (ref 12.0–15.0)
Immature Granulocytes: 0 %
Lymphocytes Relative: 8 %
Lymphs Abs: 0.8 10*3/uL (ref 0.7–4.0)
MCH: 25.5 pg — ABNORMAL LOW (ref 26.0–34.0)
MCHC: 31.9 g/dL (ref 30.0–36.0)
MCV: 79.8 fL — ABNORMAL LOW (ref 80.0–100.0)
Monocytes Absolute: 0.7 10*3/uL (ref 0.1–1.0)
Monocytes Relative: 7 %
Neutro Abs: 8.2 10*3/uL — ABNORMAL HIGH (ref 1.7–7.7)
Neutrophils Relative %: 81 %
Platelets: 322 10*3/uL (ref 150–400)
RBC: 4.4 MIL/uL (ref 3.87–5.11)
RDW: 18.2 % — ABNORMAL HIGH (ref 11.5–15.5)
WBC: 10 10*3/uL (ref 4.0–10.5)
nRBC: 0 % (ref 0.0–0.2)

## 2021-07-23 LAB — COMPREHENSIVE METABOLIC PANEL
ALT: 23 U/L (ref 0–44)
AST: 40 U/L (ref 15–41)
Albumin: 3 g/dL — ABNORMAL LOW (ref 3.5–5.0)
Alkaline Phosphatase: 401 U/L — ABNORMAL HIGH (ref 38–126)
Anion gap: 7 (ref 5–15)
BUN: 19 mg/dL (ref 8–23)
CO2: 26 mmol/L (ref 22–32)
Calcium: 8.7 mg/dL — ABNORMAL LOW (ref 8.9–10.3)
Chloride: 106 mmol/L (ref 98–111)
Creatinine, Ser: 1.06 mg/dL — ABNORMAL HIGH (ref 0.44–1.00)
GFR, Estimated: 60 mL/min — ABNORMAL LOW (ref >60)
Glucose, Bld: 97 mg/dL (ref 70–99)
Potassium: 3.6 mmol/L (ref 3.5–5.1)
Sodium: 139 mmol/L (ref 135–145)
Total Bilirubin: 0.5 mg/dL (ref 0.3–1.2)
Total Protein: 6.5 g/dL (ref 6.5–8.1)

## 2021-07-23 LAB — HEPATITIS B SURFACE ANTIGEN: Hepatitis B Surface Ag: NONREACTIVE

## 2021-07-23 LAB — HEPATITIS B CORE ANTIBODY, TOTAL: Hep B Core Total Ab: NONREACTIVE

## 2021-07-23 MED ORDER — FLUOROURACIL CHEMO INJECTION 2.5 GM/50ML
400.0000 mg/m2 | Freq: Once | INTRAVENOUS | Status: AC
  Administered 2021-07-23: 600 mg via INTRAVENOUS
  Filled 2021-07-23: qty 12

## 2021-07-23 MED ORDER — SODIUM CHLORIDE 0.9 % IV SOLN
10.0000 mg | Freq: Once | INTRAVENOUS | Status: AC
  Administered 2021-07-23: 10 mg via INTRAVENOUS
  Filled 2021-07-23: qty 1

## 2021-07-23 MED ORDER — SODIUM CHLORIDE 0.9 % IV SOLN
2400.0000 mg/m2 | INTRAVENOUS | Status: AC
  Administered 2021-07-23: 3750 mg via INTRAVENOUS
  Filled 2021-07-23: qty 75

## 2021-07-23 MED ORDER — PALONOSETRON HCL INJECTION 0.25 MG/5ML: 0.2500 mg | Freq: Once | INTRAVENOUS | Status: DC

## 2021-07-23 MED ORDER — SODIUM CHLORIDE 0.9 % IV SOLN
INTRAVENOUS | Status: AC
  Filled 2021-07-23: qty 250

## 2021-07-23 MED ORDER — LEUCOVORIN CALCIUM INJECTION 350 MG
600.0000 mg | Freq: Once | INTRAVENOUS | Status: AC
  Administered 2021-07-23: 600 mg via INTRAVENOUS
  Filled 2021-07-23: qty 30

## 2021-07-23 NOTE — Addendum Note (Signed)
Addended by: Luella Cook on: 07/23/2021 02:41 PM   Modules accepted: Orders

## 2021-07-23 NOTE — Patient Instructions (Signed)
[facility]  Discharge Instructions: Thank you for choosing [facility] to provide your oncology and hematology care.  If you have a lab appointment with the Cancer Center, please go directly to the Cancer Center and check in at the registration area.  Wear comfortable clothing and clothing appropriate for easy access to any Portacath or PICC line.   We strive to give you quality time with your provider. You may need to reschedule your appointment if you arrive late (15 or more minutes).  Arriving late affects you and other patients whose appointments are after yours.  Also, if you miss three or more appointments without notifying the office, you may be dismissed from the clinic at the provider's discretion.      For prescription refill requests, have your pharmacy contact our office and allow 72 hours for refills to be completed.    Today you received the following chemotherapy and/or immunotherapy agents       To help prevent nausea and vomiting after your treatment, we encourage you to take your nausea medication as directed.  BELOW ARE SYMPTOMS THAT SHOULD BE REPORTED IMMEDIATELY: *FEVER GREATER THAN 100.4 F (38 C) OR HIGHER *CHILLS OR SWEATING *NAUSEA AND VOMITING THAT IS NOT CONTROLLED WITH YOUR NAUSEA MEDICATION *UNUSUAL SHORTNESS OF BREATH *UNUSUAL BRUISING OR BLEEDING *URINARY PROBLEMS (pain or burning when urinating, or frequent urination) *BOWEL PROBLEMS (unusual diarrhea, constipation, pain near the anus) TENDERNESS IN MOUTH AND THROAT WITH OR WITHOUT PRESENCE OF ULCERS (sore throat, sores in mouth, or a toothache) UNUSUAL RASH, SWELLING OR PAIN  UNUSUAL VAGINAL DISCHARGE OR ITCHING   Items with * indicate a potential emergency and should be followed up as soon as possible or go to the Emergency Department if any problems should occur.  Please show the CHEMOTHERAPY ALERT CARD or IMMUNOTHERAPY ALERT CARD at check-in to the Emergency  Department and triage nurse.  Should you have questions after your visit or need to cancel or reschedule your appointment, please contact [facility]  [phone] and follow the prompts.  Office hours are 8:00 a.m. to 4:30 p.m. Monday - Friday. Please note that voicemails left after 4:00 p.m. may not be returned until the following business day.  We are closed weekends and major holidays. You have access to a nurse at all times for urgent questions. Please call the main number to the clinic [phone] and follow the prompts.  For any non-urgent questions, you may also contact your provider using MyChart. We now offer e-Visits for anyone 18 and older to request care online for non-urgent symptoms. For details visit [email or web address].   Also download the MyChart app! Go to the app store, search "MyChart", open the app, select [facility], and log in with your MyChart username and password.  Due to Covid, a mask is required upon entering the hospital/clinic. If you do not have a mask, one will be given to you upon arrival. For doctor visits, patients may have 1 support person aged 18 or older with them. For treatment visits, patients cannot have anyone with them due to current Covid guidelines and our immunocompromised population.  

## 2021-07-23 NOTE — Progress Notes (Signed)
Hematology/Oncology Consult note [facility]  [phone] [phone]  Patient Care Team: [facility] as PCP - General   Name of the patient: Cheryl Baldwin  [id number]  06/23/1961   Date of visit: 07/23/21  Diagnosis-metastatic colon cancer with liver metastases  Chief complaint/ Reason for visit-on treatment assessment prior to cycle 1 of infusional 5-FU chemotherapy  Heme/Onc history: Patient is a 60 year old female who underwent a CT chest in June 2022 for symptoms of shortness of breath which showed a 5.5 cm soft tissue attenuation in the liver hilum.  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  Evidence of underlying cirrhosis.  Partially imaged large cecal mass was seen.  Patient underwent liver biopsy which showed metastatic adenocarcinoma consistent with colon primary and tumor.  She was not deemed to be a candidate for surgical resection of the liver and colon lesion and was referred to [facility] health for consideration of systemic chemotherapy.  Patient seen by Dr. Tasia Catchings and was recommended adjuvant FOLFOX chemotherapy with consideration for bevacizumab in the future.  Foundation testing currently pending.  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  Also had a history of MI many years ago.  Interval history-patient is here with her sister today.  Patient reports ongoing fatigue and weight loss.  Appetite is fair and she mainly sustains herself on small frequent meals.  Denies any abdominal pain.  ECOG PS- 2 Pain scale- 0  Review of systems- Review of Systems  Constitutional:  Positive for malaise/fatigue and weight loss. Negative for chills  and fever.       Change in appetite  HENT:  Negative for congestion, ear discharge and nosebleeds.   Eyes:  Negative for blurred vision.  Respiratory:  Negative for cough, hemoptysis, sputum production, shortness of breath and wheezing.   Cardiovascular:  Negative for chest pain, palpitations, orthopnea and claudication.  Gastrointestinal:  Negative for abdominal pain, blood in stool, constipation, diarrhea, heartburn, melena, nausea and vomiting.  Genitourinary:  Negative for dysuria, flank pain, frequency, hematuria and urgency.  Musculoskeletal:  Negative for back pain, joint pain and myalgias.  Skin:  Negative for rash.  Neurological:  Negative for dizziness, tingling, focal weakness, seizures, weakness and headaches.  Endo/Heme/Allergies:  Does not bruise/bleed easily.  Psychiatric/Behavioral:  Negative for depression and suicidal ideas. The patient does not have insomnia.      Allergies  Allergen Reactions   Penicillins Shortness Of Breath    Elevates blood pressure Elevates blood pressure      Past Medical History:  Diagnosis Date   Heart attack ([place])    Heart disease    heart attack - no stents   High cholesterol    Hx of blood clots    legs   Hypertension    Kidney disease    Stroke [facility])      Past Surgical History:  Procedure Laterality Date   APPENDECTOMY     LIVER BIOPSY     PORTA CATH INSERTION N/A 07/14/2021   Procedure: PORTA CATH INSERTION;  Surgeon: Algernon Huxley, MD;  Location: [facility] [place] [facility];  Service: Cardiovascular;  Laterality: N/A;    Social History   Socioeconomic History  Marital status: Divorced    Spouse name: Not on file   Number of children: Not on file   Years of education: Not on file   Highest education level: Not on file  Occupational History   Not on file  Tobacco Use   Smoking status: Every Day    Packs/day: 1.00    Years: 40.00    Pack years: 40.00    Types: Cigarettes   Smokeless tobacco: Never  Vaping Use    Vaping Use: Never used  Substance and Sexual Activity   Alcohol use: Not Currently    Comment: rarely   Drug use: No   Sexual activity: Not on file  Other Topics Concern   Not on file  Social History Narrative   Not on file   Social Determinants of Health   Financial Resource Strain: Not on file  Food Insecurity: Not on file  Transportation Needs: Not on file  Physical Activity: Not on file  Stress: Not on file  Social Connections: Not on file  Intimate Partner Violence: Not on file    Family History  Problem Relation Age of Onset   Other Mother        "old age"   Alzheimer's disease Mother    Heart attack Father    Breast cancer Paternal Aunt      Current Outpatient Medications:    acetaminophen (TYLENOL) 500 MG tablet, Take by mouth., Disp: , Rfl:    albuterol (VENTOLIN HFA) 108 (90 Base) MCG/ACT inhaler, Inhale 1-2 puffs into the lungs every 6 (six) hours as needed for wheezing or shortness of breath., Disp: 1 each, Rfl: 0   metoprolol succinate (TOPROL-XL) 25 MG 24 hr tablet, Take 25 mg by mouth daily., Disp: , Rfl:    rivaroxaban (XARELTO) 10 MG TABS tablet, Take 1 tablet by mouth daily with breakfast., Disp: , Rfl:    rosuvastatin (CRESTOR) 20 MG tablet, Take 20 mg by mouth at bedtime., Disp: , Rfl:    cyclobenzaprine (FLEXERIL) 10 MG tablet, Take 1 tablet (10 mg total) by mouth 3 (three) times daily as needed for muscle spasms. (Patient not taking: Reported on 07/23/2021), Disp: 30 tablet, Rfl: 0   diazepam (VALIUM) 5 MG tablet, Take by mouth. (Patient not taking: Reported on 07/23/2021), Disp: , Rfl:    fluticasone (FLONASE) 50 MCG/ACT nasal spray, Place 2 sprays into both nostrils daily. (Patient not taking: Reported on 07/23/2021), Disp: 15.8 mL, Rfl: 0   gabapentin (NEURONTIN) 100 MG capsule, Take by mouth. (Patient not taking: Reported on 07/03/2021), Disp: , Rfl:    lidocaine-prilocaine (EMLA) cream, Apply to affected area once, Disp: 30 g, Rfl: 3   ondansetron  (ZOFRAN) 8 MG tablet, Take 1 tablet (8 mg total) by mouth 2 (two) times daily as needed for refractory nausea / vomiting. Start on day 3 after chemotherapy. (Patient not taking: Reported on 07/23/2021), Disp: 30 tablet, Rfl: 1   prochlorperazine (COMPAZINE) 10 MG tablet, Take 1 tablet (10 mg total) by mouth every 6 (six) hours as needed (Nausea or vomiting). (Patient not taking: Reported on 07/23/2021), Disp: 30 tablet, Rfl: 1   promethazine-dextromethorphan (PROMETHAZINE-DM) 6.25-15 MG/5ML syrup, Take 5 mLs by mouth 4 (four) times daily as needed for cough. (Patient not taking: Reported on 07/03/2021), Disp: 180 mL, Rfl: 0   traMADol (ULTRAM) 50 MG tablet, Take 1 tablet (50 mg total) by mouth every 8 (eight) hours as needed for moderate pain or severe pain. (Patient not taking: Reported on 07/23/2021), Disp:  15 tablet, Rfl: 0 No current facility-administered medications for this visit.  Facility-Administered Medications Ordered in Other Visits:    0.9 %  sodium chloride infusion, , Intravenous, Continuous, Sindy Guadeloupe, MD, Stopped at 07/23/21 1107   fluorouracil (ADRUCIL) 3,750 mg in sodium chloride 0.9 % 75 mL chemo infusion, 2,400 mg/m2 (Treatment Plan Recorded), Intravenous, 1 day or 1 dose, Sindy Guadeloupe, MD, 3,750 mg at 07/23/21 1109  Physical exam:  Vitals:   07/23/21 0831  BP: 94/79  Pulse: 98  Resp: 16  Temp: (!) 97 F (36.1 C)  SpO2: 100%  Weight: 111 lb 10.6 oz (50.7 kg)   Physical Exam Constitutional:      Comments: Patient appears thin and cachectic.  In no acute distress  Cardiovascular:     Rate and Rhythm: Regular rhythm. Tachycardia present.     Heart sounds: Normal heart sounds.  Pulmonary:     Effort: Pulmonary effort is normal.     Breath sounds: Normal breath sounds.  Abdominal:     General: Bowel sounds are normal. There is no distension.     Palpations: Abdomen is soft.     Tenderness: There is no abdominal tenderness.  Skin:    General: Skin is warm and dry.   Neurological:     Mental Status: She is alert and oriented to person, place, and time.     CMP Latest Ref Rng & Units 07/23/2021  Glucose 70 - 99 mg/dL 97  BUN 8 - 23 mg/dL 19  Creatinine 0.44 - 1.00 mg/dL 1.06(H)  Sodium 135 - 145 mmol/L 139  Potassium 3.5 - 5.1 mmol/L 3.6  Chloride 98 - 111 mmol/L 106  CO2 22 - 32 mmol/L 26  Calcium 8.9 - 10.3 mg/dL 8.7(L)  Total Protein 6.5 - 8.1 g/dL 6.5  Total Bilirubin 0.3 - 1.2 mg/dL 0.5  Alkaline Phos 38 - 126 U/L 401(H)  AST 15 - 41 U/L 40  ALT 0 - 44 U/L 23   CBC Latest Ref Rng & Units 07/23/2021  WBC 4.0 - 10.5 K/uL 10.0  Hemoglobin 12.0 - 15.0 g/dL 11.2(L)  Hematocrit 36.0 - 46.0 % 35.1(L)  Platelets 150 - 400 K/uL 322    No images are attached to the encounter.  PERIPHERAL VASCULAR CATHETERIZATION  Result Date: 07/14/2021 See surgical note for result.  ECHOCARDIOGRAM COMPLETE  Result Date: 07/22/2021    ECHOCARDIOGRAM REPORT   Patient Name:   AYEISHA EANES Date of Exam: 07/22/2021 Medical Rec #:  [id number]   Height:       67.0 in Accession #:    [id number]  Weight:       112.0 lb Date of Birth:  26-Jul-1961  BSA:          1.581 m Patient Age:    13 years    BP:           90/74 mmHg Patient Gender: F           HR:           102 bpm. Exam Location:  [facility] Procedure: 2D Echo, Color Doppler and Cardiac Doppler Indications:     Z09 Chemotherapy; I25.2 History of MI  History:         Patient has no prior history of Echocardiogram examinations.                  Stroke; Risk Factors:Hypertension and HCL.  Sonographer:     Charmayne Sheer Referring Phys:  [phone] [place]  Diagnosing Phys: Nelva Bush MD  Sonographer Comments: Suboptimal parasternal window. Global longitudinal strain was attempted. IMPRESSIONS  1. Left ventricular ejection fraction, by estimation, is >55%. The left ventricle has normal function. Left ventricular endocardial border not optimally defined to evaluate regional wall motion. Left ventricular diastolic parameters are  indeterminate.  2. Right ventricular systolic function is normal. The right ventricular size is normal. Tricuspid regurgitation signal is inadequate for assessing PA pressure.  3. The mitral valve is grossly normal. No evidence of mitral valve regurgitation. No evidence of mitral stenosis.  4. The aortic valve was not well visualized. Aortic valve regurgitation is not visualized. No aortic stenosis is present.  5. The inferior vena cava is normal in size with greater than 50% respiratory variability, suggesting right atrial pressure of 3 mmHg. FINDINGS  Left Ventricle: Left ventricular ejection fraction, by estimation, is >55%. The left ventricle has normal function. Left ventricular endocardial border not optimally defined to evaluate regional wall motion. Global longitudinal strain performed but not reported based on interpreter judgement due to suboptimal tracking. The left ventricular internal cavity size was normal in size. There is no left ventricular hypertrophy. Left ventricular diastolic parameters are indeterminate. Right Ventricle: The right ventricular size is normal. No increase in right ventricular wall thickness. Right ventricular systolic function is normal. Tricuspid regurgitation signal is inadequate for assessing PA pressure. Left Atrium: Left atrial size was normal in size. Right Atrium: Right atrial size was normal in size. Pericardium: There is no evidence of pericardial effusion. Mitral Valve: The mitral valve is grossly normal. No evidence of mitral valve regurgitation. No evidence of mitral valve stenosis. MV peak gradient, 3.7 mmHg. The mean mitral valve gradient is 2.0 mmHg. Tricuspid Valve: The tricuspid valve is grossly normal. Tricuspid valve regurgitation is trivial. Aortic Valve: The aortic valve was not well visualized. Aortic valve regurgitation is not visualized. No aortic stenosis is present. Aortic valve mean gradient measures 4.0 mmHg. Aortic valve peak gradient measures 6.6  mmHg. Aortic valve area, by VTI measures 1.47 cm. Pulmonic Valve: The pulmonic valve was not well visualized. Pulmonic valve regurgitation is not visualized. No evidence of pulmonic stenosis. Aorta: The aortic root is normal in size and structure. Pulmonary Artery: The pulmonary artery is not well seen. Venous: The inferior vena cava is normal in size with greater than 50% respiratory variability, suggesting right atrial pressure of 3 mmHg. IAS/Shunts: The interatrial septum was not well visualized.  LEFT VENTRICLE PLAX 2D LVIDd:         3.50 cm     Diastology LVIDs:         3.20 cm     LV e' medial:    7.07 cm/s LV PW:         0.80 cm     LV E/e' medial:  9.7 LV IVS:        0.70 cm     LV e' lateral:   10.30 cm/s LVOT diam:     1.70 cm     LV E/e' lateral: 6.7 LV SV:         32 LV SV Index:   20 LVOT Area:     2.27 cm  LV Volumes (MOD) LV vol d, MOD A2C: 64.5 ml LV vol d, MOD A4C: 49.1 ml LV vol s, MOD A2C: 30.1 ml LV vol s, MOD A4C: 25.6 ml LV SV MOD A2C:     34.4 ml LV SV MOD A4C:     49.1 ml LV SV  MOD BP:      29.6 ml RIGHT VENTRICLE RV Basal diam:  2.30 cm RV S prime:     8.16 cm/s LEFT ATRIUM           Index      RIGHT ATRIUM          Index LA diam:      1.90 cm 1.20 cm/m RA Area:     7.42 cm LA Vol (A4C): 5.8 ml  3.69 ml/m RA Volume:   13.10 ml 8.29 ml/m  AORTIC VALVE                   PULMONIC VALVE AV Area (Vmax):    1.56 cm    PV Vmax:       0.85 m/s AV Area (Vmean):   1.50 cm    PV Vmean:      59.100 cm/s AV Area (VTI):     1.47 cm    PV VTI:        0.153 m AV Vmax:           128.00 cm/s PV Peak grad:  2.9 mmHg AV Vmean:          94.900 cm/s PV Mean grad:  2.0 mmHg AV VTI:            0.214 m AV Peak Grad:      6.6 mmHg AV Mean Grad:      4.0 mmHg LVOT Vmax:         88.10 cm/s LVOT Vmean:        62.800 cm/s LVOT VTI:          0.139 m LVOT/AV VTI ratio: 0.65  AORTA Ao Root diam: 3.10 cm MITRAL VALVE MV Area (PHT): 7.74 cm    SHUNTS MV Area VTI:   1.91 cm    Systemic VTI:  0.14 m MV Peak grad:   3.7 mmHg    Systemic Diam: 1.70 cm MV Mean grad:  2.0 mmHg MV Vmax:       0.97 m/s MV Vmean:      66.4 cm/s MV Decel Time: 98 msec MV E velocity: 68.60 cm/s MV A velocity: 84.40 cm/s MV E/A ratio:  0.81 Christopher End MD Electronically signed by Nelva Bush MD Signature Date/Time: 07/22/2021/6:44:07 PM    Final      Assessment and plan- Patient is a 60 y.o. female with history of stage IV colon cancer with liver metastases here for on treatment assessment prior to cycle 5-FU chemotherapy  Patient is originally seen by Dr. Tasia Catchings and I discussed her case with Dr. Tasia Catchings as well today.  Given patient's borderline performance status plan is to proceed with 5-FU chemotherapy alone.  If she tolerates it well plan is to add oxaliplatin in 2 weeks.  Foundation 1 testing for comprehensive Ras panel is currently pending.  Patient understands that she will have to move her care to [facility] after this month since we are not doing chemo infusions and [place] anymore.  I will defer decision to add Avastin to her chemotherapy regimen to Dr. Tasia Catchings when she sees her in 4 weeks.  Discussed risks and benefits of 5-FU chemotherapy including all but not limited to nausea, vomiting, low blood counts, risk of infections and hospitalization.  Treatment will be given with a palliative intent.  Patient understands and agrees to proceed as planned.  Patient has baseline cirrhosis and will be seeing Dr. Bonna Gains for further work-up.  I will defer  any cirrhosis labs to GI.  Patient has mild anemia today with a hemoglobin of 11.2 with an MCV of 79.  Will check ferritin and iron studies at next visit   Visit Diagnosis 1. Malignant neoplasm of ascending colon ([place])   2. Encounter for antineoplastic chemotherapy      Dr. Randa Evens, MD, MPH [facility] at [facility] [id number] 07/23/2021 12:40 PM

## 2021-07-24 LAB — HEPATITIS B SURFACE ANTIBODY, QUANTITATIVE: Hep B S AB Quant (Post): <3.1 m[IU]/mL — ABNORMAL LOW (ref >9.9)

## 2021-07-25 ENCOUNTER — Other Ambulatory Visit: Payer: Self-pay

## 2021-07-25 ENCOUNTER — Inpatient Hospital Stay: Payer: Medicare Other | Attending: Oncology

## 2021-07-25 ENCOUNTER — Telehealth: Payer: Self-pay

## 2021-07-25 VITALS — BP 105/65 | HR 88 | Temp 97.0°F | Resp 18

## 2021-07-25 DIAGNOSIS — C182 Malignant neoplasm of ascending colon: Secondary | ICD-10-CM

## 2021-07-25 DIAGNOSIS — Z5111 Encounter for antineoplastic chemotherapy: Secondary | ICD-10-CM | POA: Diagnosis not present

## 2021-07-25 MED ORDER — HEPARIN SOD (PORK) LOCK FLUSH 100 UNIT/ML IV SOLN
500.0000 [IU] | Freq: Once | INTRAVENOUS | Status: DC | PRN
  Filled 2021-07-25: qty 5

## 2021-07-25 MED ORDER — HEPARIN SOD (PORK) LOCK FLUSH 100 UNIT/ML IV SOLN
500.0000 [IU] | Freq: Once | INTRAVENOUS | Status: AC
  Administered 2021-07-25: 500 [IU] via INTRAVENOUS
  Filled 2021-07-25: qty 5

## 2021-07-25 NOTE — Telephone Encounter (Signed)
Telephone call to patient for follow up after receiving first infusion.   Patient states infusion went great.  States eating good and drinking plenty of fluids.   Denies any nausea or vomiting.  Encouraged patient to call for any concerns or questions. 

## 2021-07-31 ENCOUNTER — Inpatient Hospital Stay (HOSPITAL_BASED_OUTPATIENT_CLINIC_OR_DEPARTMENT_OTHER): Payer: Medicare Other | Admitting: Hospice and Palliative Medicine

## 2021-07-31 DIAGNOSIS — Z515 Encounter for palliative care: Secondary | ICD-10-CM

## 2021-07-31 DIAGNOSIS — C182 Malignant neoplasm of ascending colon: Secondary | ICD-10-CM

## 2021-07-31 NOTE — Progress Notes (Signed)
Virtual Visit via Video Note  I connected with Cheryl Baldwin on 07/31/21 at  1:30 PM EDT by a video enabled telemedicine application and verified that I am speaking with the correct person using two identifiers.  Location: Patient: Home Provider: Clinic   I discussed the limitations of evaluation and management by telemedicine and the availability of in person appointments. The patient expressed understanding and agreed to proceed.  History of Present Illness: Cheryl Baldwin is a 60 y.o. 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  Palliative care was consulted to help address goals and manage ongoing symptoms.   Observations/Objective: Spoke with patient and sister.  Patient reports she is doing well.  She denies any significant changes or concerns.  No symptomatic complaints at present.  She reports tolerating chemotherapy so far without any significant adverse effects.  No issues with medications nor need for refills.  Patient reports stable appetite and performance status.  Assessment and Plan: Stage IV colon cancer -on active chemo FOLFOX plus Bev.  Now followed by Dr. Janese Banks.  Seems to be doing well symptomatically.  Will follow  Follow Up Instructions: Follow-up MyChart visit 1 to 2 months   I discussed the assessment and treatment plan with the patient. The patient was provided an opportunity to ask questions and all were answered. The patient agreed with the plan and demonstrated an understanding of the instructions.   The patient was advised to call back or seek an in-person evaluation if the symptoms worsen or if the condition fails to improve as anticipated.  I provided 10 minutes of non-face-to-face time during this encounter.   [name], NP

## 2021-08-06 ENCOUNTER — Inpatient Hospital Stay (HOSPITAL_BASED_OUTPATIENT_CLINIC_OR_DEPARTMENT_OTHER): Payer: Medicare Other | Admitting: Oncology

## 2021-08-06 ENCOUNTER — Other Ambulatory Visit: Payer: Self-pay

## 2021-08-06 ENCOUNTER — Inpatient Hospital Stay: Payer: Medicare Other | Attending: Oncology

## 2021-08-06 ENCOUNTER — Inpatient Hospital Stay: Payer: Medicare Other

## 2021-08-06 ENCOUNTER — Encounter: Payer: Self-pay | Admitting: Oncology

## 2021-08-06 ENCOUNTER — Other Ambulatory Visit: Payer: Self-pay | Admitting: *Deleted

## 2021-08-06 VITALS — BP 106/79 | HR 96 | Temp 96.9°F | Resp 16 | Ht 67.0 in | Wt 111.1 lb

## 2021-08-06 DIAGNOSIS — Z5111 Encounter for antineoplastic chemotherapy: Secondary | ICD-10-CM | POA: Diagnosis not present

## 2021-08-06 DIAGNOSIS — D509 Iron deficiency anemia, unspecified: Secondary | ICD-10-CM

## 2021-08-06 DIAGNOSIS — C182 Malignant neoplasm of ascending colon: Secondary | ICD-10-CM

## 2021-08-06 LAB — CBC WITH DIFFERENTIAL/PLATELET
Abs Immature Granulocytes: 0.03 10*3/uL (ref 0.00–0.07)
Basophils Absolute: 0.1 10*3/uL (ref 0.0–0.1)
Basophils Relative: 1 %
Eosinophils Absolute: 0.2 10*3/uL (ref 0.0–0.5)
Eosinophils Relative: 3 %
HCT: 33.8 % — ABNORMAL LOW (ref 36.0–46.0)
Hemoglobin: 10.7 g/dL — ABNORMAL LOW (ref 12.0–15.0)
Immature Granulocytes: 0 %
Lymphocytes Relative: 10 %
Lymphs Abs: 0.8 10*3/uL (ref 0.7–4.0)
MCH: 25.5 pg — ABNORMAL LOW (ref 26.0–34.0)
MCHC: 31.7 g/dL (ref 30.0–36.0)
MCV: 80.5 fL (ref 80.0–100.0)
Monocytes Absolute: 0.6 10*3/uL (ref 0.1–1.0)
Monocytes Relative: 7 %
Neutro Abs: 6.3 10*3/uL (ref 1.7–7.7)
Neutrophils Relative %: 79 %
Platelets: 270 10*3/uL (ref 150–400)
RBC: 4.2 MIL/uL (ref 3.87–5.11)
RDW: 19.3 % — ABNORMAL HIGH (ref 11.5–15.5)
WBC: 8 10*3/uL (ref 4.0–10.5)
nRBC: 0 % (ref 0.0–0.2)

## 2021-08-06 LAB — COMPREHENSIVE METABOLIC PANEL
ALT: 17 U/L (ref 0–44)
AST: 31 U/L (ref 15–41)
Albumin: 3.4 g/dL — ABNORMAL LOW (ref 3.5–5.0)
Alkaline Phosphatase: 281 U/L — ABNORMAL HIGH (ref 38–126)
Anion gap: 8 (ref 5–15)
BUN: 23 mg/dL (ref 8–23)
CO2: 23 mmol/L (ref 22–32)
Calcium: 8.8 mg/dL — ABNORMAL LOW (ref 8.9–10.3)
Chloride: 106 mmol/L (ref 98–111)
Creatinine, Ser: 1.17 mg/dL — ABNORMAL HIGH (ref 0.44–1.00)
GFR, Estimated: 53 mL/min — ABNORMAL LOW (ref >60)
Glucose, Bld: 111 mg/dL — ABNORMAL HIGH (ref 70–99)
Potassium: 3.5 mmol/L (ref 3.5–5.1)
Sodium: 137 mmol/L (ref 135–145)
Total Bilirubin: 0.9 mg/dL (ref 0.3–1.2)
Total Protein: 6.6 g/dL (ref 6.5–8.1)

## 2021-08-06 LAB — IRON AND TIBC
Iron: 19 ug/dL — ABNORMAL LOW (ref 28–170)
Saturation Ratios: 6 % — ABNORMAL LOW (ref 10.4–31.8)
TIBC: 302 ug/dL (ref 250–450)
UIBC: 283 ug/dL

## 2021-08-06 LAB — FERRITIN: Ferritin: 49 ng/mL (ref 11–307)

## 2021-08-06 MED ORDER — SODIUM CHLORIDE 0.9 % IV SOLN
INTRAVENOUS | Status: AC
  Filled 2021-08-06: qty 250

## 2021-08-06 MED ORDER — FLUOROURACIL CHEMO INJECTION 2.5 GM/50ML
400.0000 mg/m2 | Freq: Once | INTRAVENOUS | Status: AC
  Administered 2021-08-06: 600 mg via INTRAVENOUS
  Filled 2021-08-06: qty 12

## 2021-08-06 MED ORDER — GABAPENTIN 300 MG PO CAPS: 300.0000 mg | ORAL_CAPSULE | ORAL | 0 refills | Status: DC

## 2021-08-06 MED ORDER — SODIUM CHLORIDE 0.9 % IV SOLN
2400.0000 mg/m2 | INTRAVENOUS | Status: AC
  Administered 2021-08-06: 3750 mg via INTRAVENOUS
  Filled 2021-08-06: qty 75

## 2021-08-06 MED ORDER — LEUCOVORIN CALCIUM INJECTION 350 MG
600.0000 mg | Freq: Once | INTRAVENOUS | Status: AC
  Administered 2021-08-06: 600 mg via INTRAVENOUS
  Filled 2021-08-06: qty 30

## 2021-08-06 NOTE — Progress Notes (Signed)
Hematology/Oncology Consult note [facility]  Telephone:([phone] [phone]  Patient Care Team: [facility] as PCP - General   Name of the patient: Cheryl Baldwin  [id number]  [birth year]   Date of visit: 08/06/21  Diagnosis- metastatic colon cancer with liver metastases  Chief complaint/ Reason for visit-on treatment assessment prior to cycle 2 of FOLFOX chemotherapy  Heme/Onc history: Patient is a 60 year old female who underwent a CT chest in June 2022 for symptoms of shortness of breath which showed a 5.5 cm soft tissue attenuation in the liver hilum.  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  Evidence of underlying cirrhosis.  Partially imaged large cecal mass was seen.  Patient underwent liver biopsy which showed metastatic adenocarcinoma consistent with colon primary and tumor.  She was not deemed to be a candidate for surgical resection of the liver and colon lesion and was referred to [facility] health for consideration of systemic chemotherapy.  Patient seen by Dr. Tasia Catchings and was recommended adjuvant FOLFOX chemotherapy with consideration for bevacizumab in the future.  Foundation 1 testing results showed K-ras Q61L, NRAS wild-type.  Microsatellite stable.  Tumor mutational burden 11 mutations per MB.  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  Also had a history of MI many years ago.  Interval history-patient said that after she received her 1 cycle of 5-FU chemotherapy she noticed worsening tingling numbness in her right foot.  She has noticed her right foot is somewhat weaker which makes it difficult for her to walk at times.  Denies any significant  nausea or vomiting  ECOG PS- 2 Pain scale- 3   Review of systems- Review of Systems  Constitutional:  Negative for chills, fever, malaise/fatigue and weight loss.  HENT:  Negative for congestion, ear discharge and nosebleeds.   Eyes:  Negative for blurred vision.  Respiratory:  Negative for cough, hemoptysis, sputum production, shortness of breath and wheezing.   Cardiovascular:  Negative for chest pain, palpitations, orthopnea and claudication.  Gastrointestinal:  Negative for abdominal pain, blood in stool, constipation, diarrhea, heartburn, melena, nausea and vomiting.  Genitourinary:  Negative for dysuria, flank pain, frequency, hematuria and urgency.  Musculoskeletal:  Negative for back pain, joint pain and myalgias.  Skin:  Negative for rash.  Neurological:  Positive for sensory change (peripheral neuropathy). Negative for dizziness, tingling, focal weakness, seizures, weakness and headaches.  Endo/Heme/Allergies:  Does not bruise/bleed easily.  Psychiatric/Behavioral:  Negative for depression and suicidal ideas. The patient does not have insomnia.      Allergies  Allergen Reactions   Penicillins Shortness Of Breath    Elevates blood pressure Elevates blood pressure      Past Medical History:  Diagnosis Date   Heart attack ([place])    Heart disease    heart attack - no stents   High cholesterol    Hx of blood clots    legs   Hypertension    Kidney disease    Stroke [facility])      Past Surgical History:  Procedure Laterality Date   APPENDECTOMY     LIVER BIOPSY     PORTA CATH INSERTION N/A 07/14/2021   Procedure: PORTA CATH INSERTION;  Surgeon: Lucky Cowboy,  Erskine Squibb, MD;  Location: [facility];  Service: Cardiovascular;  Laterality: N/A;    Social History   Socioeconomic History   Marital status: Divorced    Spouse name: Not on file   Number of children: Not on file   Years of education: Not on file   Highest education level: Not on file  Occupational History    Not on file  Tobacco Use   Smoking status: Every Day    Packs/day: 1.00    Years: 40.00    Pack years: 40.00    Types: Cigarettes   Smokeless tobacco: Never  Vaping Use   Vaping Use: Never used  Substance and Sexual Activity   Alcohol use: Not Currently    Comment: rarely   Drug use: No   Sexual activity: Not on file  Other Topics Concern   Not on file  Social History Narrative   Not on file   Social Determinants of Health   Financial Resource Strain: Not on file  Food Insecurity: Not on file  Transportation Needs: Not on file  Physical Activity: Not on file  Stress: Not on file  Social Connections: Not on file  Intimate Partner Violence: Not on file    Family History  Problem Relation Age of Onset   Other Mother        "old age"   Alzheimer's disease Mother    Heart attack Father    Breast cancer Paternal Aunt      Current Outpatient Medications:    acetaminophen (TYLENOL) 500 MG tablet, Take by mouth., Disp: , Rfl:    albuterol (VENTOLIN HFA) 108 (90 Base) MCG/ACT inhaler, Inhale 1-2 puffs into the lungs every 6 (six) hours as needed for wheezing or shortness of breath., Disp: 1 each, Rfl: 0   cyclobenzaprine (FLEXERIL) 10 MG tablet, Take 1 tablet (10 mg total) by mouth 3 (three) times daily as needed for muscle spasms. (Patient not taking: Reported on 07/23/2021), Disp: 30 tablet, Rfl: 0   diazepam (VALIUM) 5 MG tablet, Take by mouth. (Patient not taking: Reported on 07/23/2021), Disp: , Rfl:    fluticasone (FLONASE) 50 MCG/ACT nasal spray, Place 2 sprays into both nostrils daily. (Patient not taking: Reported on 07/23/2021), Disp: 15.8 mL, Rfl: 0   gabapentin (NEURONTIN) 100 MG capsule, Take by mouth. (Patient not taking: Reported on 07/03/2021), Disp: , Rfl:    lidocaine-prilocaine (EMLA) cream, Apply to affected area once, Disp: 30 g, Rfl: 3   metoprolol succinate (TOPROL-XL) 25 MG 24 hr tablet, Take 25 mg by mouth daily., Disp: , Rfl:    ondansetron (ZOFRAN) 8 MG  tablet, Take 1 tablet (8 mg total) by mouth 2 (two) times daily as needed for refractory nausea / vomiting. Start on day 3 after chemotherapy. (Patient not taking: Reported on 07/23/2021), Disp: 30 tablet, Rfl: 1   prochlorperazine (COMPAZINE) 10 MG tablet, Take 1 tablet (10 mg total) by mouth every 6 (six) hours as needed (Nausea or vomiting). (Patient not taking: Reported on 07/23/2021), Disp: 30 tablet, Rfl: 1   promethazine-dextromethorphan (PROMETHAZINE-DM) 6.25-15 MG/5ML syrup, Take 5 mLs by mouth 4 (four) times daily as needed for cough. (Patient not taking: Reported on 07/03/2021), Disp: 180 mL, Rfl: 0   rivaroxaban (XARELTO) 10 MG TABS tablet, Take 1 tablet by mouth daily with breakfast., Disp: , Rfl:    rosuvastatin (CRESTOR) 20 MG tablet, Take 20 mg by mouth at bedtime., Disp: , Rfl:    traMADol (ULTRAM) 50 MG tablet, Take  1 tablet (50 mg total) by mouth every 8 (eight) hours as needed for moderate pain or severe pain. (Patient not taking: Reported on 07/23/2021), Disp: 15 tablet, Rfl: 0 No current facility-administered medications for this visit.  Facility-Administered Medications Ordered in Other Visits:    0.9 %  sodium chloride infusion, , Intravenous, Continuous, Sindy Guadeloupe, MD, Stopped at 07/23/21 1107   heparin lock flush 100 unit/mL, 500 Units, Intracatheter, Once PRN, Sindy Guadeloupe, MD  Physical exam:  Vitals:   08/06/21 0906  BP: 106/79  Pulse: 96  Resp: 16  Temp: (!) 96.9 F (36.1 C)  TempSrc: Tympanic  Weight: 111 lb 1.6 oz (50.4 kg)  Height: $Remove'5\' 7"'HLkYgFn$  (1.702 m)   Physical Exam HENT:     Head: Normocephalic.  Cardiovascular:     Rate and Rhythm: Normal rate and regular rhythm.     Heart sounds: Normal heart sounds.  Pulmonary:     Effort: Pulmonary effort is normal.     Breath sounds: Normal breath sounds.  Abdominal:     General: Bowel sounds are normal.     Palpations: Abdomen is soft.  Skin:    General: Skin is warm and dry.  Neurological:     Mental Status:  She is alert and oriented to person, place, and time.     Comments: Mild right foot drop is noted on examination     CMP Latest Ref Rng & Units 08/06/2021  Glucose 70 - 99 mg/dL 111(H)  BUN 8 - 23 mg/dL 23  Creatinine 0.44 - 1.00 mg/dL 1.17(H)  Sodium 135 - 145 mmol/L 137  Potassium 3.5 - 5.1 mmol/L 3.5  Chloride 98 - 111 mmol/L 106  CO2 22 - 32 mmol/L 23  Calcium 8.9 - 10.3 mg/dL 8.8(L)  Total Protein 6.5 - 8.1 g/dL 6.6  Total Bilirubin 0.3 - 1.2 mg/dL 0.9  Alkaline Phos 38 - 126 U/L 281(H)  AST 15 - 41 U/L 31  ALT 0 - 44 U/L 17   CBC Latest Ref Rng & Units 08/06/2021  WBC 4.0 - 10.5 K/uL 8.0  Hemoglobin 12.0 - 15.0 g/dL 10.7(L)  Hematocrit 36.0 - 46.0 % 33.8(L)  Platelets 150 - 400 K/uL 270    No images are attached to the encounter.  PERIPHERAL VASCULAR CATHETERIZATION  Result Date: 07/14/2021 See surgical note for result.  ECHOCARDIOGRAM COMPLETE  Result Date: 07/22/2021    ECHOCARDIOGRAM REPORT   Patient Name:   BELVA KOZIEL Date of Exam: 07/22/2021 Medical Rec #:  [id number]   Height:       67.0 in Accession #:    [id number]  Weight:       112.0 lb Date of Birth:  1961/08/03  BSA:          1.581 m Patient Age:    50 years    BP:           90/74 mmHg Patient Gender: F           HR:           102 bpm. Exam Location:  [facility] Procedure: 2D Echo, Color Doppler and Cardiac Doppler Indications:     Z09 Chemotherapy; I25.2 History of MI  History:         Patient has no prior history of Echocardiogram examinations.                  Stroke; Risk Factors:Hypertension and HCL.  Sonographer:     Charmayne Sheer Referring Phys:  [id number] ZHOU YU Diagnosing Phys: Nelva Bush MD  Sonographer Comments: Suboptimal parasternal window. Global longitudinal strain was attempted. IMPRESSIONS  1. Left ventricular ejection fraction, by estimation, is >55%. The left ventricle has normal function. Left ventricular endocardial border not optimally defined to evaluate regional wall motion. Left ventricular  diastolic parameters are indeterminate.  2. Right ventricular systolic function is normal. The right ventricular size is normal. Tricuspid regurgitation signal is inadequate for assessing PA pressure.  3. The mitral valve is grossly normal. No evidence of mitral valve regurgitation. No evidence of mitral stenosis.  4. The aortic valve was not well visualized. Aortic valve regurgitation is not visualized. No aortic stenosis is present.  5. The inferior vena cava is normal in size with greater than 50% respiratory variability, suggesting right atrial pressure of 3 mmHg. FINDINGS  Left Ventricle: Left ventricular ejection fraction, by estimation, is >55%. The left ventricle has normal function. Left ventricular endocardial border not optimally defined to evaluate regional wall motion. Global longitudinal strain performed but not reported based on interpreter judgement due to suboptimal tracking. The left ventricular internal cavity size was normal in size. There is no left ventricular hypertrophy. Left ventricular diastolic parameters are indeterminate. Right Ventricle: The right ventricular size is normal. No increase in right ventricular wall thickness. Right ventricular systolic function is normal. Tricuspid regurgitation signal is inadequate for assessing PA pressure. Left Atrium: Left atrial size was normal in size. Right Atrium: Right atrial size was normal in size. Pericardium: There is no evidence of pericardial effusion. Mitral Valve: The mitral valve is grossly normal. No evidence of mitral valve regurgitation. No evidence of mitral valve stenosis. MV peak gradient, 3.7 mmHg. The mean mitral valve gradient is 2.0 mmHg. Tricuspid Valve: The tricuspid valve is grossly normal. Tricuspid valve regurgitation is trivial. Aortic Valve: The aortic valve was not well visualized. Aortic valve regurgitation is not visualized. No aortic stenosis is present. Aortic valve mean gradient measures 4.0 mmHg. Aortic valve peak  gradient measures 6.6 mmHg. Aortic valve area, by VTI measures 1.47 cm. Pulmonic Valve: The pulmonic valve was not well visualized. Pulmonic valve regurgitation is not visualized. No evidence of pulmonic stenosis. Aorta: The aortic root is normal in size and structure. Pulmonary Artery: The pulmonary artery is not well seen. Venous: The inferior vena cava is normal in size with greater than 50% respiratory variability, suggesting right atrial pressure of 3 mmHg. IAS/Shunts: The interatrial septum was not well visualized.  LEFT VENTRICLE PLAX 2D LVIDd:         3.50 cm     Diastology LVIDs:         3.20 cm     LV e' medial:    7.07 cm/s LV PW:         0.80 cm     LV E/e' medial:  9.7 LV IVS:        0.70 cm     LV e' lateral:   10.30 cm/s LVOT diam:     1.70 cm     LV E/e' lateral: 6.7 LV SV:         32 LV SV Index:   20 LVOT Area:     2.27 cm  LV Volumes (MOD) LV vol d, MOD A2C: 64.5 ml LV vol d, MOD A4C: 49.1 ml LV vol s, MOD A2C: 30.1 ml LV vol s, MOD A4C: 25.6 ml LV SV MOD A2C:     34.4 ml LV SV MOD A4C:     49.1  ml LV SV MOD BP:      29.6 ml RIGHT VENTRICLE RV Basal diam:  2.30 cm RV S prime:     8.16 cm/s LEFT ATRIUM           Index      RIGHT ATRIUM          Index LA diam:      1.90 cm 1.20 cm/m RA Area:     7.42 cm LA Vol (A4C): 5.8 ml  3.69 ml/m RA Volume:   13.10 ml 8.29 ml/m  AORTIC VALVE                   PULMONIC VALVE AV Area (Vmax):    1.56 cm    PV Vmax:       0.85 m/s AV Area (Vmean):   1.50 cm    PV Vmean:      59.100 cm/s AV Area (VTI):     1.47 cm    PV VTI:        0.153 m AV Vmax:           128.00 cm/s PV Peak grad:  2.9 mmHg AV Vmean:          94.900 cm/s PV Mean grad:  2.0 mmHg AV VTI:            0.214 m AV Peak Grad:      6.6 mmHg AV Mean Grad:      4.0 mmHg LVOT Vmax:         88.10 cm/s LVOT Vmean:        62.800 cm/s LVOT VTI:          0.139 m LVOT/AV VTI ratio: 0.65  AORTA Ao Root diam: 3.10 cm MITRAL VALVE MV Area (PHT): 7.74 cm    SHUNTS MV Area VTI:   1.91 cm    Systemic VTI:   0.14 m MV Peak grad:  3.7 mmHg    Systemic Diam: 1.70 cm MV Mean grad:  2.0 mmHg MV Vmax:       0.97 m/s MV Vmean:      66.4 cm/s MV Decel Time: 98 msec MV E velocity: 68.60 cm/s MV A velocity: 84.40 cm/s MV E/A ratio:  0.81 Christopher End MD Electronically signed by Nelva Bush MD Signature Date/Time: 07/22/2021/6:44:07 PM    Final      Assessment and plan- Patient is a 60 y.o. female with history of stage IV colon cancer with liver metastases.  She is here for on treatment assessment prior to cycle 2 of FOLFOX chemotherapy  Patient tolerated cycle 1 of 5-FU chemotherapy relatively well.  She does however report new onset numbness in her right foot and there is evidence of mild foot drop noted in her right foot.  Explained to the patient that 5-FU chemotherapy does not typically cause neuropathy.  She did not receive oxaliplatin with cycle 1.  Patient had similar symptoms after her initial stroke and then it improved after that.  States that the symptoms are somewhat come back after her chemotherapy.  She will proceed with cycle 2 of 5-FU chemotherapy single agent today.  I will refer her for home physical therapy for her foot drop as well.  I will start her on gabapentin 300 mg at night which she will increase to twice a day after 3 to 4 days.  If symptoms persist after this chemotherapy options would be to consider neurology referral at that time.  I am concerned about  adding oxaliplatin at this time.  Doing FOLFIRI instead of FOLFOX chemotherapy is an option  She will come back for port labs CBC with differential CMP and CEA and see Dr. Tasia Catchings at [facility] in 2 weeks and receive cycle 3 of 5-FU chemotherapy.  Her foundation 1 testing did not showed wild-type NRAS and KRAS Q6 1L mutation.  She will therefore be a potential candidate for panitumumab or cetuximab.  Given that this is a right-sided tumor (RAS inhibitors have better efficacy in ) left-sided tumors-a Avastin is also a consideration down the  line.  Although patient has a prior history of stroke patient is currently on Xarelto.  I will defer this decision to Dr. Tasia Catchings.    Patient also noted to have a tumor mutational burden of11 mut/mb although it was microsatellite stable.  There would also be a potential role for immunotherapy in the second line setting given that the tumor mutation burden is greater than 10.  Normocytic anemia: Ferritin and iron studies from today are pending.  We will add B12 and folate to next set of labs   Visit Diagnosis 1. Encounter for antineoplastic chemotherapy   2. Malignant neoplasm of ascending colon (HCC)      Dr. Randa Evens, MD, MPH [facility] [phone] 08/06/2021 12:11 PM

## 2021-08-06 NOTE — Progress Notes (Signed)
Pt. Eating and drinking good. For most part bowels are good. She sometimes has diarrhea and sometimes diarrhea but it lasts 1 day and then gets normal again. Her only concern is that her leg on right whic is the side she had stroke has twitching at times as well as numbness at times

## 2021-08-08 ENCOUNTER — Inpatient Hospital Stay: Payer: Medicare Other

## 2021-08-08 ENCOUNTER — Other Ambulatory Visit: Payer: Self-pay

## 2021-08-08 VITALS — BP 107/72 | HR 98 | Temp 97.0°F | Resp 18

## 2021-08-08 DIAGNOSIS — Z5111 Encounter for antineoplastic chemotherapy: Secondary | ICD-10-CM | POA: Diagnosis not present

## 2021-08-08 DIAGNOSIS — C182 Malignant neoplasm of ascending colon: Secondary | ICD-10-CM

## 2021-08-08 MED ORDER — HEPARIN SOD (PORK) LOCK FLUSH 100 UNIT/ML IV SOLN
500.0000 [IU] | Freq: Once | INTRAVENOUS | Status: AC
  Administered 2021-08-08: 500 [IU] via INTRAVENOUS
  Filled 2021-08-08: qty 5

## 2021-08-08 MED ORDER — SODIUM CHLORIDE 0.9% FLUSH
10.0000 mL | Freq: Once | INTRAVENOUS | Status: AC
  Administered 2021-08-08: 10 mL via INTRAVENOUS
  Filled 2021-08-08: qty 10

## 2021-08-13 ENCOUNTER — Telehealth: Payer: Self-pay | Admitting: *Deleted

## 2021-08-13 ENCOUNTER — Encounter: Payer: Self-pay | Admitting: *Deleted

## 2021-08-13 MED ORDER — GABAPENTIN 100 MG PO CAPS: 200.0000 mg | ORAL_CAPSULE | ORAL | 0 refills | Status: DC

## 2021-08-13 NOTE — Telephone Encounter (Signed)
I called to speak to pt to see if she is doing ok with the gabapentin. Her sister live together. She says that the pt had said tha tshe started on the 300 mg at night and it made her sleepy but when she added the 300mg  in am and pm. She sleeps a lot. The pt. Told her that she feels like her leg is harder to use and her being sleepy does not help. Pt. Wanted a lower dose. Dr. Janese Banks said to give her 200 mg at night  for 7 days and then if that is ok with her then increase to 200 mg in am and then 200 mg at night and see if that helps. I told the sister that I would send med in and send her my chart. She was ok with this

## 2021-08-20 ENCOUNTER — Encounter: Payer: Self-pay | Admitting: Oncology

## 2021-08-20 ENCOUNTER — Other Ambulatory Visit: Payer: Self-pay

## 2021-08-20 ENCOUNTER — Inpatient Hospital Stay: Payer: Medicare Other

## 2021-08-20 ENCOUNTER — Inpatient Hospital Stay (HOSPITAL_BASED_OUTPATIENT_CLINIC_OR_DEPARTMENT_OTHER): Payer: Medicare Other | Admitting: Oncology

## 2021-08-20 ENCOUNTER — Inpatient Hospital Stay: Payer: Medicare Other | Attending: Oncology

## 2021-08-20 VITALS — BP 109/72 | HR 106 | Temp 96.9°F | Resp 17 | Wt 113.0 lb

## 2021-08-20 DIAGNOSIS — C182 Malignant neoplasm of ascending colon: Secondary | ICD-10-CM

## 2021-08-20 DIAGNOSIS — Z5111 Encounter for antineoplastic chemotherapy: Secondary | ICD-10-CM

## 2021-08-20 DIAGNOSIS — D508 Other iron deficiency anemias: Secondary | ICD-10-CM

## 2021-08-20 DIAGNOSIS — C787 Secondary malignant neoplasm of liver and intrahepatic bile duct: Secondary | ICD-10-CM | POA: Insufficient documentation

## 2021-08-20 DIAGNOSIS — I252 Old myocardial infarction: Secondary | ICD-10-CM

## 2021-08-20 DIAGNOSIS — K7030 Alcoholic cirrhosis of liver without ascites: Secondary | ICD-10-CM

## 2021-08-20 DIAGNOSIS — Z452 Encounter for adjustment and management of vascular access device: Secondary | ICD-10-CM | POA: Insufficient documentation

## 2021-08-20 DIAGNOSIS — Z95828 Presence of other vascular implants and grafts: Secondary | ICD-10-CM

## 2021-08-20 LAB — CBC WITH DIFFERENTIAL/PLATELET
Abs Immature Granulocytes: 0.05 10*3/uL (ref 0.00–0.07)
Basophils Absolute: 0.1 10*3/uL (ref 0.0–0.1)
Basophils Relative: 1 %
Eosinophils Absolute: 0.2 10*3/uL (ref 0.0–0.5)
Eosinophils Relative: 2 %
HCT: 34.9 % — ABNORMAL LOW (ref 36.0–46.0)
Hemoglobin: 10.9 g/dL — ABNORMAL LOW (ref 12.0–15.0)
Immature Granulocytes: 0 %
Lymphocytes Relative: 8 %
Lymphs Abs: 0.9 10*3/uL (ref 0.7–4.0)
MCH: 25.8 pg — ABNORMAL LOW (ref 26.0–34.0)
MCHC: 31.2 g/dL (ref 30.0–36.0)
MCV: 82.7 fL (ref 80.0–100.0)
Monocytes Absolute: 0.9 10*3/uL (ref 0.1–1.0)
Monocytes Relative: 8 %
Neutro Abs: 9.1 10*3/uL — ABNORMAL HIGH (ref 1.7–7.7)
Neutrophils Relative %: 81 %
Platelets: 318 10*3/uL (ref 150–400)
RBC: 4.22 MIL/uL (ref 3.87–5.11)
RDW: 18.9 % — ABNORMAL HIGH (ref 11.5–15.5)
WBC: 11.2 10*3/uL — ABNORMAL HIGH (ref 4.0–10.5)
nRBC: 0 % (ref 0.0–0.2)

## 2021-08-20 LAB — FOLATE: Folate: 79 ng/mL (ref >5.9)

## 2021-08-20 LAB — COMPREHENSIVE METABOLIC PANEL
ALT: 13 U/L (ref 0–44)
AST: 22 U/L (ref 15–41)
Albumin: 3.2 g/dL — ABNORMAL LOW (ref 3.5–5.0)
Alkaline Phosphatase: 236 U/L — ABNORMAL HIGH (ref 38–126)
Anion gap: 9 (ref 5–15)
BUN: 17 mg/dL (ref 8–23)
CO2: 24 mmol/L (ref 22–32)
Calcium: 8.3 mg/dL — ABNORMAL LOW (ref 8.9–10.3)
Chloride: 104 mmol/L (ref 98–111)
Creatinine, Ser: 1.1 mg/dL — ABNORMAL HIGH (ref 0.44–1.00)
GFR, Estimated: 57 mL/min — ABNORMAL LOW (ref >60)
Glucose, Bld: 103 mg/dL — ABNORMAL HIGH (ref 70–99)
Potassium: 3.5 mmol/L (ref 3.5–5.1)
Sodium: 137 mmol/L (ref 135–145)
Total Bilirubin: 0.9 mg/dL (ref 0.3–1.2)
Total Protein: 6.4 g/dL — ABNORMAL LOW (ref 6.5–8.1)

## 2021-08-20 LAB — VITAMIN B12: Vitamin B-12: 665 pg/mL (ref 180–914)

## 2021-08-20 MED ORDER — SODIUM CHLORIDE 0.9 % IV SOLN
10.0000 mg | Freq: Once | INTRAVENOUS | Status: AC
  Administered 2021-08-20: 10 mg via INTRAVENOUS
  Filled 2021-08-20: qty 10

## 2021-08-20 MED ORDER — SODIUM CHLORIDE 0.9 % IV SOLN
2400.0000 mg/m2 | INTRAVENOUS | Status: DC
  Administered 2021-08-20: 3750 mg via INTRAVENOUS
  Filled 2021-08-20: qty 75

## 2021-08-20 MED ORDER — FLUOROURACIL CHEMO INJECTION 2.5 GM/50ML
400.0000 mg/m2 | Freq: Once | INTRAVENOUS | Status: AC
  Administered 2021-08-20: 600 mg via INTRAVENOUS
  Filled 2021-08-20: qty 12

## 2021-08-20 MED ORDER — SODIUM CHLORIDE 0.9% FLUSH
10.0000 mL | Freq: Once | INTRAVENOUS | Status: DC
  Filled 2021-08-20: qty 10

## 2021-08-20 MED ORDER — SODIUM CHLORIDE 0.9 % IV SOLN
Freq: Once | INTRAVENOUS | Status: AC
  Filled 2021-08-20: qty 250

## 2021-08-20 MED ORDER — PALONOSETRON HCL INJECTION 0.25 MG/5ML
0.2500 mg | Freq: Once | INTRAVENOUS | Status: AC
  Administered 2021-08-20: 0.25 mg via INTRAVENOUS
  Filled 2021-08-20: qty 5

## 2021-08-20 MED ORDER — LEUCOVORIN CALCIUM INJECTION 350 MG
600.0000 mg | Freq: Once | INTRAVENOUS | Status: AC
  Administered 2021-08-20: 600 mg via INTRAVENOUS
  Filled 2021-08-20: qty 30

## 2021-08-20 MED ORDER — IRON-VITAMIN C 65-125 MG PO TABS: 1.0000 | ORAL_TABLET | Freq: Every day | ORAL | 2 refills | Status: AC

## 2021-08-20 NOTE — Progress Notes (Signed)
Per MD ok to treat with HR 

## 2021-08-20 NOTE — Patient Instructions (Signed)
[facility] ONCOLOGY  Discharge Instructions: Thank you for choosing [facility] to provide your oncology and hematology care.  If you have a lab appointment with the [facility], please go directly to the [facility] and check in at the registration area.  Wear comfortable clothing and clothing appropriate for easy access to any Portacath or PICC line.   We strive to give you quality time with your provider. You may need to reschedule your appointment if you arrive late (15 or more minutes).  Arriving late affects you and other patients whose appointments are after yours.  Also, if you miss three or more appointments without notifying the office, you may be dismissed from the clinic at the provider's discretion.      For prescription refill requests, have your pharmacy contact our office and allow 72 hours for refills to be completed.    Today you received the following chemotherapy and/or immunotherapy agents : Leucovorin / 5FU   To help prevent nausea and vomiting after your treatment, we encourage you to take your nausea medication as directed.  BELOW ARE SYMPTOMS THAT SHOULD BE REPORTED IMMEDIATELY: *FEVER GREATER THAN 100.4 F (38 C) OR HIGHER *CHILLS OR SWEATING *NAUSEA AND VOMITING THAT IS NOT CONTROLLED WITH YOUR NAUSEA MEDICATION *UNUSUAL SHORTNESS OF BREATH *UNUSUAL BRUISING OR BLEEDING *URINARY PROBLEMS (pain or burning when urinating, or frequent urination) *BOWEL PROBLEMS (unusual diarrhea, constipation, pain near the anus) TENDERNESS IN MOUTH AND THROAT WITH OR WITHOUT PRESENCE OF ULCERS (sore throat, sores in mouth, or a toothache) UNUSUAL RASH, SWELLING OR PAIN  UNUSUAL VAGINAL DISCHARGE OR ITCHING   Items with * indicate a potential emergency and should be followed up as soon as possible or go to the Emergency Department if any problems should occur.  Please show the CHEMOTHERAPY ALERT CARD or IMMUNOTHERAPY ALERT CARD at  check-in to the Emergency Department and triage nurse.  Should you have questions after your visit or need to cancel or reschedule your appointment, please contact [facility]  [phone] and follow the prompts.  Office hours are 8:00 a.m. to 4:30 p.m. Monday - Friday. Please note that voicemails left after 4:00 p.m. may not be returned until the following business day.  We are closed weekends and major holidays. You have access to a nurse at all times for urgent questions. Please call the main number to the clinic [phone] and follow the prompts.  For any non-urgent questions, you may also contact your provider using MyChart. We now offer e-Visits for anyone 33 and older to request care online for non-urgent symptoms. For details visit [email or web address].   Also download the MyChart app! Go to the app store, search "MyChart", open the app, select [facility], and log in with your MyChart username and password.  Due to Covid, a mask is required upon entering the hospital/clinic. If you do not have a mask, one will be given to you upon arrival. For doctor visits, patients may have 1 support person aged 71 or older with them. For treatment visits, patients cannot have anyone with them due to current Covid guidelines and our immunocompromised population.

## 2021-08-20 NOTE — Progress Notes (Signed)
Patient here for oncology follow-up appointment, concerns of Neuopathy and chronic SOB

## 2021-08-20 NOTE — Progress Notes (Signed)
Hematology/Oncology Consult note 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  female with PMH listed below was seen in consultation at the request of  [place]  for evaluation of stage IV colon cancer.   June 2022.  Patient presented to primary care provider for evaluation of unintentional weight loss over the past 3 months.  04/30/2021, CT chest was obtained. CT shortness of breath ill-defined irregular hypoattenuated and hypoattenuated adrenal masses on the background of heterogeneous attenuation of the liver parenchyma. 5.5 cm soft tissue attenuation within the liver hilum.  Partially visualized linear metallic density within the soft tissue density at the hilum of the liver.  Splenomegaly, aortic atherosclerosis. Soft tissue/bronchoscopy nodules within the right lung.  Patient establish care with [facility] 05/23/2021. 06/06/2021 patient underwent MRI abdomen with and without contrast. Redemonstrated large peripheral arterial phase hyperenhancing mass in the right hepatic lobe.  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 Partially imaged large cecal mass seen.  06/24/2021, patient underwent ultrasound liver biopsy Pathology showed metastatic adenocarcinoma, consistent with spread from a colon primary.  Patient prefers to have oncology care closer to her home and was referred to establish care. Patient is a current everyday smoker.  She has a history of alcohol use.  Patient denies any regular alcohol use currently.  She drinks socially.  Denies nausea, vomiting, blood in the  stool.  She lives with her sister [name] who accompanied patient to today's visit.  Appetite is poor.  She has some right sided abdominal pressure discomfort.  History of stroke 30 years ago with residual right-sided weakness.  She takes Xarelto 10 mg daily.  She is able to ambulate independently.   History of MI several years ago.  Patient has a son who lives out of state.  Per patient and her sister, son is not much involved in patient's current life.  07/09/2021 started on 5-FU treatment.   INTERVAL HISTORY Cheryl Baldwin is a 60 y.o. female who has above history reviewed by me today presents for follow up visit for management of stage IV colon cancer.  She was seen by Dr.Rao for first 2 cycles of 5-FU done at [facility] site.  Overall she tolerates well. Appetite has improved and she has gained weight. Denies any nausea vomiting, diarrhea.  She is accompanied by her sister. Patient denies any pain today She complains about right lower extremity numbness and right foot drop, symptoms are worse since the start of the chemotherapy. Symptoms are similar to what she experienced previously with her stroke.  She takes garbapentin, 200mg  QHS and 100mg  in AM.    Review of Systems  Constitutional:  Positive for fatigue. Negative for appetite change, chills and fever.  HENT:   Negative for hearing loss and voice change.   Eyes:  Negative for eye problems.  Respiratory:  Negative for chest tightness and cough.   Cardiovascular:  Negative for chest pain.  Gastrointestinal:  Negative for abdominal distention and blood in stool.       RUQ pressure  Endocrine: Negative for hot flashes.  Genitourinary:  Negative for difficulty urinating and frequency.   Musculoskeletal:  Negative for  arthralgias.  Skin:  Negative for itching and rash.  Neurological:  Positive for numbness. Negative for extremity weakness.       Chronic right side weakness due to previous stroke   Hematological:  Negative for adenopathy.   Psychiatric/Behavioral:  Negative for confusion.    MEDICAL HISTORY:  Past Medical History:  Diagnosis Date   Heart attack ([place])    Heart disease    heart attack - no stents   High cholesterol    Hx of blood clots    legs   Hypertension    Kidney disease    Stroke [facility])     SURGICAL HISTORY: Past Surgical History:  Procedure Laterality Date   APPENDECTOMY     LIVER BIOPSY     PORTA CATH INSERTION N/A 07/14/2021   Procedure: PORTA CATH INSERTION;  Surgeon: Algernon Huxley, MD;  Location: [place] [facility];  Service: Cardiovascular;  Laterality: N/A;    SOCIAL HISTORY: Social History   Socioeconomic History   Marital status: Divorced    Spouse name: Not on file   Number of children: Not on file   Years of education: Not on file   Highest education level: Not on file  Occupational History   Not on file  Tobacco Use   Smoking status: Every Day    Packs/day: 1.00    Years: 40.00    Pack years: 40.00    Types: Cigarettes   Smokeless tobacco: Never  Vaping Use   Vaping Use: Never used  Substance and Sexual Activity   Alcohol use: Not Currently    Comment: rarely   Drug use: No   Sexual activity: Not Currently  Other Topics Concern   Not on file  Social History Narrative   Not on file   Social Determinants of Health   Financial Resource Strain: Not on file  Food Insecurity: Not on file  Transportation Needs: Not on file  Physical Activity: Not on file  Stress: Not on file  Social Connections: Not on file  Intimate Partner Violence: Not on file    FAMILY HISTORY: Family History  Problem Relation Age of Onset   Other Mother        "old age"   Alzheimer's disease Mother    Heart attack Father    Breast cancer Paternal Aunt     ALLERGIES:  is allergic to penicillins.  MEDICATIONS:  Current Outpatient Medications  Medication Sig Dispense Refill   acetaminophen (TYLENOL) 500 MG tablet Take 500 mg by mouth every 6 (six) hours as needed.      fluticasone (FLONASE) 50 MCG/ACT nasal spray Place 2 sprays into both nostrils daily. 15.8 mL 0   gabapentin (NEURONTIN) 100 MG capsule Take 2 capsules (200 mg total) by mouth as directed. Take 2 capsules at night for 7 days and then increase to 2 capsules in am and 2 capsules at night 53 capsule 0   lidocaine-prilocaine (EMLA) cream Apply to affected area once 30 g 3   rivaroxaban (XARELTO) 10 MG TABS tablet Take 1 tablet by mouth daily with breakfast.     rosuvastatin (CRESTOR) 20 MG tablet Take 20 mg by mouth at bedtime.     albuterol (VENTOLIN HFA) 108 (90 Base) MCG/ACT inhaler Inhale 1-2 puffs into the lungs every 6 (six) hours as needed for wheezing or shortness of breath. (Patient not taking: No sig reported) 1 each 0   cyclobenzaprine (FLEXERIL) 10 MG tablet Take 1 tablet (10 mg total) by mouth 3 (  three) times daily as needed for muscle spasms. (Patient not taking: No sig reported) 30 tablet 0   diazepam (VALIUM) 5 MG tablet Take by mouth. (Patient not taking: No sig reported)     ondansetron (ZOFRAN) 8 MG tablet Take 1 tablet (8 mg total) by mouth 2 (two) times daily as needed for refractory nausea / vomiting. Start on day 3 after chemotherapy. (Patient not taking: No sig reported) 30 tablet 1   prochlorperazine (COMPAZINE) 10 MG tablet Take 1 tablet (10 mg total) by mouth every 6 (six) hours as needed (Nausea or vomiting). (Patient not taking: No sig reported) 30 tablet 1   traMADol (ULTRAM) 50 MG tablet Take 1 tablet (50 mg total) by mouth every 8 (eight) hours as needed for moderate pain or severe pain. (Patient not taking: No sig reported) 15 tablet 0   No current facility-administered medications for this visit.   Facility-Administered Medications Ordered in Other Visits  Medication Dose Route Frequency Provider Last Rate Last Admin   0.9 %  sodium chloride infusion   Intravenous Continuous Sindy Guadeloupe, MD   Stopped at 07/23/21 1107   0.9 %  sodium chloride infusion   Intravenous  Continuous Sindy Guadeloupe, MD   Stopped at 08/06/21 1115   heparin lock flush 100 unit/mL  500 Units Intracatheter Once PRN Sindy Guadeloupe, MD         PHYSICAL EXAMINATION: ECOG PERFORMANCE STATUS: 1 - Symptomatic but completely ambulatory Vitals:   08/20/21 0912  BP: 109/72  Pulse: (!) 106  Resp: 17  Temp: (!) 96.9 F (36.1 C)  SpO2: 97%   Filed Weights   08/20/21 0912  Weight: 113 lb (51.3 kg)    Physical Exam Constitutional:      General: She is not in acute distress.    Comments: Thin, ambulates independantly  HENT:     Head: Normocephalic and atraumatic.  Eyes:     General: No scleral icterus. Cardiovascular:     Rate and Rhythm: Normal rate and regular rhythm.     Heart sounds: Normal heart sounds.  Pulmonary:     Effort: Pulmonary effort is normal. No respiratory distress.     Breath sounds: No wheezing.  Abdominal:     General: Bowel sounds are normal. There is no distension.     Palpations: Abdomen is soft.  Musculoskeletal:        General: No deformity. Normal range of motion.     Cervical back: Normal range of motion and neck supple.  Skin:    General: Skin is warm and dry.     Findings: No erythema or rash.  Neurological:     Mental Status: She is alert and oriented to person, place, and time. Mental status is at baseline.     Cranial Nerves: No cranial nerve deficit.     Coordination: Coordination normal.     Comments: Decreased right dorsiflextion  Psychiatric:        Mood and Affect: Mood normal.    LABORATORY DATA:  I have reviewed the data as listed Lab Results  Component Value Date   WBC 11.2 (H) 08/20/2021   HGB 10.9 (L) 08/20/2021   HCT 34.9 (L) 08/20/2021   MCV 82.7 08/20/2021   PLT 318 08/20/2021   Recent Labs    07/23/21 0816 08/06/21 0837 08/20/21 0841  NA 139 137 137  K 3.6 3.5 3.5  CL 106 106 104  CO2 $Re'26 23 24  'MCW$ GLUCOSE 97 111* 103*  BUN '19 23 17  '$ CREATININE 1.06* 1.17* 1.10*  CALCIUM 8.7* 8.8* 8.3*  GFRNONAA 60* 53*  57*  PROT 6.5 6.6 6.4*  ALBUMIN 3.0* 3.4* 3.2*  AST 40 31 22  ALT $Re'23 17 13  'lcf$ ALKPHOS 401* 281* 236*  BILITOT 0.5 0.9 0.9    Iron/TIBC/Ferritin/ %Sat    Component Value Date/Time   IRON 19 (L) 08/06/2021 0837   TIBC 302 08/06/2021 0837   FERRITIN 49 08/06/2021 0837   IRONPCTSAT 6 (L) 08/06/2021 0837      RADIOGRAPHIC STUDIES: I have personally reviewed the radiological images as listed and agreed with the findings in the report. ECHOCARDIOGRAM COMPLETE  Result Date: 07/22/2021    ECHOCARDIOGRAM REPORT   Patient Name:   RYHANNA DUNSMORE Date of Exam: 07/22/2021 Medical Rec #:  [id number]   Height:       67.0 in Accession #:    [id number]  Weight:       112.0 lb Date of Birth:  [birth year]  BSA:          1.581 m Patient Age:    79 years    BP:           90/74 mmHg Patient Gender: F           HR:           102 bpm. Exam Location:  [facility] Procedure: 2D Echo, Color Doppler and Cardiac Doppler Indications:     Z09 Chemotherapy; I25.2 History of MI  History:         Patient has no prior history of Echocardiogram examinations.                  Stroke; Risk Factors:Hypertension and HCL.  Sonographer:     Charmayne Sheer Referring Phys:  [id number] Gabrien Mentink Diagnosing Phys: Nelva Bush MD  Sonographer Comments: Suboptimal parasternal window. Global longitudinal strain was attempted. IMPRESSIONS  1. Left ventricular ejection fraction, by estimation, is >55%. The left ventricle has normal function. Left ventricular endocardial border not optimally defined to evaluate regional wall motion. Left ventricular diastolic parameters are indeterminate.  2. Right ventricular systolic function is normal. The right ventricular size is normal. Tricuspid regurgitation signal is inadequate for assessing PA pressure.  3. The mitral valve is grossly normal. No evidence of mitral valve regurgitation. No evidence of mitral stenosis.  4. The aortic valve was not well visualized. Aortic valve regurgitation is not visualized. No aortic  stenosis is present.  5. The inferior vena cava is normal in size with greater than 50% respiratory variability, suggesting right atrial pressure of 3 mmHg. FINDINGS  Left Ventricle: Left ventricular ejection fraction, by estimation, is >55%. The left ventricle has normal function. Left ventricular endocardial border not optimally defined to evaluate regional wall motion. Global longitudinal strain performed but not reported based on interpreter judgement due to suboptimal tracking. The left ventricular internal cavity size was normal in size. There is no left ventricular hypertrophy. Left ventricular diastolic parameters are indeterminate. Right Ventricle: The right ventricular size is normal. No increase in right ventricular wall thickness. Right ventricular systolic function is normal. Tricuspid regurgitation signal is inadequate for assessing PA pressure. Left Atrium: Left atrial size was normal in size. Right Atrium: Right atrial size was normal in size. Pericardium: There is no evidence of pericardial effusion. Mitral Valve: The mitral valve is grossly normal. No evidence of mitral valve regurgitation. No evidence of mitral valve stenosis. MV peak gradient, 3.7 mmHg. The mean mitral valve gradient  is 2.0 mmHg. Tricuspid Valve: The tricuspid valve is grossly normal. Tricuspid valve regurgitation is trivial. Aortic Valve: The aortic valve was not well visualized. Aortic valve regurgitation is not visualized. No aortic stenosis is present. Aortic valve mean gradient measures 4.0 mmHg. Aortic valve peak gradient measures 6.6 mmHg. Aortic valve area, by VTI measures 1.47 cm. Pulmonic Valve: The pulmonic valve was not well visualized. Pulmonic valve regurgitation is not visualized. No evidence of pulmonic stenosis. Aorta: The aortic root is normal in size and structure. Pulmonary Artery: The pulmonary artery is not well seen. Venous: The inferior vena cava is normal in size with greater than 50% respiratory  variability, suggesting right atrial pressure of 3 mmHg. IAS/Shunts: The interatrial septum was not well visualized.  LEFT VENTRICLE PLAX 2D LVIDd:         3.50 cm     Diastology LVIDs:         3.20 cm     LV e' medial:    7.07 cm/s LV PW:         0.80 cm     LV E/e' medial:  9.7 LV IVS:        0.70 cm     LV e' lateral:   10.30 cm/s LVOT diam:     1.70 cm     LV E/e' lateral: 6.7 LV SV:         32 LV SV Index:   20 LVOT Area:     2.27 cm  LV Volumes (MOD) LV vol d, MOD A2C: 64.5 ml LV vol d, MOD A4C: 49.1 ml LV vol s, MOD A2C: 30.1 ml LV vol s, MOD A4C: 25.6 ml LV SV MOD A2C:     34.4 ml LV SV MOD A4C:     49.1 ml LV SV MOD BP:      29.6 ml RIGHT VENTRICLE RV Basal diam:  2.30 cm RV S prime:     8.16 cm/s LEFT ATRIUM           Index      RIGHT ATRIUM          Index LA diam:      1.90 cm 1.20 cm/m RA Area:     7.42 cm LA Vol (A4C): 5.8 ml  3.69 ml/m RA Volume:   13.10 ml 8.29 ml/m  AORTIC VALVE                   PULMONIC VALVE AV Area (Vmax):    1.56 cm    PV Vmax:       0.85 m/s AV Area (Vmean):   1.50 cm    PV Vmean:      59.100 cm/s AV Area (VTI):     1.47 cm    PV VTI:        0.153 m AV Vmax:           128.00 cm/s PV Peak grad:  2.9 mmHg AV Vmean:          94.900 cm/s PV Mean grad:  2.0 mmHg AV VTI:            0.214 m AV Peak Grad:      6.6 mmHg AV Mean Grad:      4.0 mmHg LVOT Vmax:         88.10 cm/s LVOT Vmean:        62.800 cm/s LVOT VTI:          0.139 m LVOT/AV VTI ratio:  0.66  AORTA Ao Root diam: 3.10 cm MITRAL VALVE MV Area (PHT): 7.74 cm    SHUNTS MV Area VTI:   1.91 cm    Systemic VTI:  0.14 m MV Peak grad:  3.7 mmHg    Systemic Diam: 1.70 cm MV Mean grad:  2.0 mmHg MV Vmax:       0.97 m/s MV Vmean:      66.4 cm/s MV Decel Time: 98 msec MV E velocity: 68.60 cm/s MV A velocity: 84.40 cm/s MV E/A ratio:  0.81 Harrell Gave End MD Electronically signed by Nelva Bush MD Signature Date/Time: 07/22/2021/6:44:07 PM    Final       ASSESSMENT & PLAN:  1. Malignant neoplasm of ascending colon  ([place])   2. Encounter for antineoplastic chemotherapy   3. Alcoholic cirrhosis of liver without ascites ([place])   4. History of MI (myocardial infarction)   5. Liver metastasis ([place])   6. Other iron deficiency anemia    #Stage IV right-sided colon cancer with liver metastasis.  Pathology was reviewed and discussed with patient.Images were independently reviewed by me and discussed with patient.   Labs are reviewed and discussed with patient. Proceed with chemotherapy 5-FU.  Trend CEA.  Plan to repeat CT after 4- 6 cycles of chemtherapy.  Consider adding additional agents, bevacizumab, panitumumab etc. Will not challenge her with oxaliplatin due to pre-existing neuropathy   Foundation one testing showed NRAS wt, KRASQ61L, TMB 11 [high], MS stable, potential candidate for immunotherapy in future  #Liver cirrhosis, likely due to previous alcohol use.  Patient has had negative hepatitis panel in 2018 at  [facility]. Check hepatitis panel, PT, PTT. Child Pugh A - 6 point.  I will defer management to GI  # History of MI [ age of 14] and Stroke [ age of 69], on Xarelto $RemoveBe'10mg'HwQdRagSQ$  daily. Last Echo per PCP note was in 2011, 50% LVEF, result is not available in current EMR. 07/22/2021 2D echo showed LVEF 55%.   # IDA, iron panel showed decreased iron saturation.  Recommend patient to start oral iron supplementation.   Supportive care measures are necessary for patient well-being and will be provided as necessary. We spent sufficient time to discuss many aspect of care, questions were answered to patient's satisfaction.    Orders Placed This Encounter  Procedures   CEA    Standing Status:   Future    Number of Occurrences:   1    Standing Expiration Date:   08/20/2022    All questions were answered. The patient knows to call the clinic with any problems questions or concerns.  cc [place]    Return of visit: 2 weeks., lab MD 5-FU  Earlie Server, MD, PhD Hematology Oncology [facility] at [facility] Pager- [phone] 08/20/2021

## 2021-08-21 ENCOUNTER — Telehealth: Payer: Self-pay

## 2021-08-21 LAB — CEA: CEA: 9.1 ng/mL — ABNORMAL HIGH (ref 0.0–4.7)

## 2021-08-21 NOTE — Telephone Encounter (Signed)
-----   Message from Earlie Server, MD sent at 08/20/2021  7:48 PM EDT ----- Please advise her that iron level is low. Advise oral iron supplementation. I sent vitron c prescription to her pharmacy. She may take otc stool softer if constipation

## 2021-08-21 NOTE — Telephone Encounter (Signed)
Called and spoke to pt's sister, [name], who takes care of patient's medical needs. She voiced understanding.

## 2021-08-22 ENCOUNTER — Inpatient Hospital Stay: Payer: Medicare Other

## 2021-08-22 ENCOUNTER — Other Ambulatory Visit: Payer: Self-pay

## 2021-08-22 DIAGNOSIS — Z5111 Encounter for antineoplastic chemotherapy: Secondary | ICD-10-CM | POA: Diagnosis not present

## 2021-08-22 DIAGNOSIS — C182 Malignant neoplasm of ascending colon: Secondary | ICD-10-CM

## 2021-08-22 MED ORDER — HEPARIN SOD (PORK) LOCK FLUSH 100 UNIT/ML IV SOLN
500.0000 [IU] | Freq: Once | INTRAVENOUS | Status: AC | PRN
  Administered 2021-08-22: 500 [IU]
  Filled 2021-08-22: qty 5

## 2021-08-22 MED ORDER — SODIUM CHLORIDE 0.9% FLUSH
10.0000 mL | INTRAVENOUS | Status: DC | PRN
  Administered 2021-08-22: 10 mL
  Filled 2021-08-22: qty 10

## 2021-08-25 ENCOUNTER — Ambulatory Visit (INDEPENDENT_AMBULATORY_CARE_PROVIDER_SITE_OTHER): Payer: Medicare Other | Admitting: Gastroenterology

## 2021-08-25 ENCOUNTER — Other Ambulatory Visit: Payer: Self-pay

## 2021-08-25 ENCOUNTER — Encounter: Payer: Self-pay | Admitting: Gastroenterology

## 2021-08-25 VITALS — BP 115/71 | HR 120 | Temp 97.4°F | Ht 67.0 in | Wt 114.0 lb

## 2021-08-25 DIAGNOSIS — C189 Malignant neoplasm of colon, unspecified: Secondary | ICD-10-CM | POA: Diagnosis not present

## 2021-08-25 DIAGNOSIS — K7030 Alcoholic cirrhosis of liver without ascites: Secondary | ICD-10-CM

## 2021-08-25 DIAGNOSIS — R748 Abnormal levels of other serum enzymes: Secondary | ICD-10-CM | POA: Diagnosis not present

## 2021-08-25 NOTE — Progress Notes (Addendum)
Cheryl Baldwin [address]  [place]  [place], [place] [address]  Main: [phone]  Fax: [phone]   Gastroenterology Consultation  Referring Provider:     Earlie Server, MD Primary Care Physician:  [facility] Reason for Consultation:     Cirrhosis        HPI:    Chief Complaint  Patient presents with   New Patient (Initial Visit)   Cirrhosis    Cheryl Baldwin is a 60 y.o. y/o female referred for consultation & management  by Dr. Shari Prows, [facility].  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  Patient was initially seen at [facility] oncology, but transferred care to [facility] oncology subsequently.  She has never had a colonoscopy in her lifetime and given that liver biopsy results showed metastatic adenocarcinoma, with imaging showing cecal mass, patient states she was told by her current oncology team that she does not need a colonoscopy.  Patient denies any blood in stool.  However, does report constipation and takes an over-the-counter stool softener for it.  No prior EGD.  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  Denies ever being admitted for complications from liver disease.  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  Past Medical History:  Diagnosis Date   Heart attack ([place])    Heart disease    heart attack - no stents   High cholesterol    Hx of blood clots    legs   Hypertension    Kidney disease    Stroke [facility])     Past Surgical  History:  Procedure Laterality Date   APPENDECTOMY     LIVER BIOPSY     PORTA CATH INSERTION N/A 07/14/2021   Procedure: PORTA CATH INSERTION;  Surgeon: Algernon Huxley, MD;  Location: [facility];  Service: Cardiovascular;  Laterality: N/A;    Prior to Admission medications   Medication Sig Start Date End Date Taking? Authorizing Provider  acetaminophen (TYLENOL) 500 MG tablet Take 500 mg by mouth every 6 (six) hours as needed.   Yes [provider]  albuterol (VENTOLIN HFA) 108 (90 Base) MCG/ACT inhaler Inhale 1-2 puffs into the lungs every 6 (six) hours as needed for wheezing or shortness of breath. 06/10/21  Yes Verda Cumins, MD  diazepam (VALIUM) 5 MG tablet Take by mouth. 05/31/20  Yes [provider]  fluticasone (FLONASE) 50 MCG/ACT nasal spray Place 2 sprays into both nostrils daily. 06/10/21  Yes Verda Cumins, MD  gabapentin (NEURONTIN) 100 MG capsule Take 2 capsules (200 mg total) by mouth as directed. Take 2 capsules at night for 7 days and then increase to 2 capsules in am and 2 capsules at night 08/13/21  Yes Sindy Guadeloupe, MD  Iron-Vitamin C 65-125 MG TABS Take 1 tablet by mouth daily. 08/20/21  Yes Earlie Server, MD  lidocaine-prilocaine (EMLA) cream Apply to affected area once 07/04/21  Yes Tasia Catchings,  Talbert Cage, MD  ondansetron (ZOFRAN) 8 MG tablet Take 1 tablet (8 mg total) by mouth 2 (two) times daily as needed for refractory nausea / vomiting. Start on day 3 after chemotherapy. 07/04/21  Yes Earlie Server, MD  prochlorperazine (COMPAZINE) 10 MG tablet Take 1 tablet (10 mg total) by mouth every 6 (six) hours as needed (Nausea or vomiting). 07/04/21  Yes Earlie Server, MD  rivaroxaban (XARELTO) 10 MG TABS tablet Take 1 tablet by mouth daily with breakfast. 12/12/19  Yes [provider]  rosuvastatin (CRESTOR) 20 MG tablet Take 20 mg by mouth at bedtime. 05/23/21  Yes [provider]  traMADol (ULTRAM) 50 MG tablet Take 1 tablet (50 mg total) by mouth every 8 (eight)  hours as needed for moderate pain or severe pain. 03/05/21  Yes Cook, Jayce G, DO  cyclobenzaprine (FLEXERIL) 10 MG tablet Take 1 tablet (10 mg total) by mouth 3 (three) times daily as needed for muscle spasms. Patient not taking: Reported on 08/25/2021 02/02/18   Norval Gable, MD    Family History  Problem Relation Age of Onset   Other Mother        "old age"   Alzheimer's disease Mother    Heart attack Father    Breast cancer Paternal Aunt      Social History   Tobacco Use   Smoking status: Every Day    Packs/day: 1.00    Years: 40.00    Pack years: 40.00    Types: Cigarettes   Smokeless tobacco: Never  Vaping Use   Vaping Use: Never used  Substance Use Topics   Alcohol use: Not Currently    Comment: rarely   Drug use: No    Allergies as of 08/25/2021 - Review Complete 08/25/2021  Allergen Reaction Noted   Penicillins Shortness Of Breath 05/25/2016    Review of Systems:    All systems reviewed and negative except where noted in HPI.   Physical Exam:  Constitutional: General:   Alert, cachectic, pleasant and cooperative in NAD BP 115/71   Pulse (!) 120   Temp (!) 97.4 F (36.3 C) (Oral)   Ht _0  (1.702 m)   Wt 114 lb (51.7 kg)   BMI 17.85 kg/m   Eyes:  Sclera clear, no icterus.   Conjunctiva pink. PERRLA  Ears:  No scars, lesions or masses, Normal auditory acuity. Nose:  No deformity, discharge, or lesions. Mouth:  No deformity or lesions, oropharynx pink & moist.  Neck:  Supple; no masses or thyromegaly.  Respiratory: Normal respiratory effort, Normal percussion  Gastrointestinal: Soft, non-tender and non-distended without masses, hepatosplenomegaly or hernias noted.  No guarding or rebound tenderness.     Cardiac: No clubbing or edema.  No cyanosis. Normal posterior tibial pedal pulses noted.  Lymphatic:  No significant cervical or axillary adenopathy.  Psych:  Alert and cooperative. Normal mood and affect.  Musculoskeletal: Dropfoot.  Head  normocephalic, atraumatic. Symmetrical without gross deformities. 5/5 Upper and Lower extremity strength bilaterally.  Skin: Warm. Intact without significant lesions or rashes. No jaundice.  Neurologic:  Face symmetrical, tongue midline, Normal sensation to touch;  grossly normal neurologically.  Psych:  Alert and oriented x3, Alert and cooperative. Normal mood and affect.   Labs: CBC    Component Value Date/Time   WBC 11.2 (H) 08/20/2021 0841   RBC 4.22 08/20/2021 0841   HGB 10.9 (L) 08/20/2021 0841   HCT 34.9 (L) 08/20/2021 0841   PLT 318 08/20/2021 0841   MCV 82.7  08/20/2021 0841   MCH 25.8 (L) 08/20/2021 0841   MCHC 31.2 08/20/2021 0841   RDW 18.9 (H) 08/20/2021 0841   LYMPHSABS 0.9 08/20/2021 0841   MONOABS 0.9 08/20/2021 0841   EOSABS 0.2 08/20/2021 0841   BASOSABS 0.1 08/20/2021 0841   CMP     Component Value Date/Time   NA 137 08/20/2021 0841   K 3.5 08/20/2021 0841   CL 104 08/20/2021 0841   CO2 24 08/20/2021 0841   GLUCOSE 103 (H) 08/20/2021 0841   BUN 17 08/20/2021 0841   CREATININE 1.10 (H) 08/20/2021 0841   CALCIUM 8.3 (L) 08/20/2021 0841   PROT 6.4 (L) 08/20/2021 0841   ALBUMIN 3.2 (L) 08/20/2021 0841   AST 22 08/20/2021 0841   ALT 13 08/20/2021 0841   ALKPHOS 236 (H) 08/20/2021 0841   BILITOT 0.9 08/20/2021 0841   GFRNONAA 57 (L) 08/20/2021 0841    Imaging Studies: MRI abdomen IMPRESSION:   1.  Redemonstrated large peripherally arterial phase hyperenhancing mass in  the right hepatic lobe. 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 These findings are most  suggestive of hepatocellular carcinoma, less likely cholangiocarcinoma or  metastasis. This lesion is amenable to percutaneous biopsy and correlation  with patient risk factors for primary liver malignancy is recommended.  2.  Partially imaged large cecal mass seen.   Recommend GI consult and  consideration for colonoscopy.  Assessment and Plan:   Cheryl Baldwin is a 60 y.o. y/o female has been referred for liver cirrhosis likely due to previous alcohol use  Patient has not had previous cirrhosis work-up Meld NA 10 based on June 24, 2021 labs Hep B core antibody, surface antibody, surface antigen were negative on July 23, 2021  I will obtain further blood work for cirrhosis to rule out other etiologies  I have messaged Dr. Tasia Catchings to confirm if oncology would like a colonoscopy.    We also discussed with patient that cirrhosis work-up includes EGD to evaluate for varices.  However, both patient and family state that they would not want an EGD or colonoscopy but they will think about it and if they change their mind, they will let us know.  Alk phos likely elevated due to underlying liver disease.  Imaging does not report any biliary obstruction and bilirubin is normal which would go against biliary obstruction as well  Alcohol abstinence encouraged  Metamucil recommended for constipation.  Titrate up to 2 times a day if no improvement after 3 to 4 days.  If she does not do well with Metamucil, she can switch to MiraLAX.  If constipation not improved after 3 to 4 weeks, patient advised to call us  Dr Cheryl Baldwin  Speech recognition software was used to dictate the above note.

## 2021-09-03 ENCOUNTER — Inpatient Hospital Stay: Payer: Medicare Other | Admitting: Occupational Therapy

## 2021-09-03 ENCOUNTER — Other Ambulatory Visit: Payer: Self-pay

## 2021-09-03 ENCOUNTER — Inpatient Hospital Stay: Payer: Medicare Other

## 2021-09-03 ENCOUNTER — Inpatient Hospital Stay (HOSPITAL_BASED_OUTPATIENT_CLINIC_OR_DEPARTMENT_OTHER): Payer: Medicare Other | Admitting: Oncology

## 2021-09-03 ENCOUNTER — Encounter: Payer: Self-pay | Admitting: Oncology

## 2021-09-03 VITALS — BP 117/70 | HR 93 | Temp 97.6°F | Wt 110.9 lb

## 2021-09-03 DIAGNOSIS — G629 Polyneuropathy, unspecified: Secondary | ICD-10-CM | POA: Diagnosis not present

## 2021-09-03 DIAGNOSIS — K7030 Alcoholic cirrhosis of liver without ascites: Secondary | ICD-10-CM

## 2021-09-03 DIAGNOSIS — C787 Secondary malignant neoplasm of liver and intrahepatic bile duct: Secondary | ICD-10-CM | POA: Diagnosis not present

## 2021-09-03 DIAGNOSIS — Z95828 Presence of other vascular implants and grafts: Secondary | ICD-10-CM

## 2021-09-03 DIAGNOSIS — C182 Malignant neoplasm of ascending colon: Secondary | ICD-10-CM

## 2021-09-03 DIAGNOSIS — F17210 Nicotine dependence, cigarettes, uncomplicated: Secondary | ICD-10-CM

## 2021-09-03 DIAGNOSIS — Z5111 Encounter for antineoplastic chemotherapy: Secondary | ICD-10-CM

## 2021-09-03 DIAGNOSIS — M21371 Foot drop, right foot: Secondary | ICD-10-CM

## 2021-09-03 LAB — COMPREHENSIVE METABOLIC PANEL
ALT: 12 U/L (ref 0–44)
AST: 23 U/L (ref 15–41)
Albumin: 3.4 g/dL — ABNORMAL LOW (ref 3.5–5.0)
Alkaline Phosphatase: 197 U/L — ABNORMAL HIGH (ref 38–126)
Anion gap: 8 (ref 5–15)
BUN: 19 mg/dL (ref 8–23)
CO2: 25 mmol/L (ref 22–32)
Calcium: 8.9 mg/dL (ref 8.9–10.3)
Chloride: 104 mmol/L (ref 98–111)
Creatinine, Ser: 1.07 mg/dL — ABNORMAL HIGH (ref 0.44–1.00)
GFR, Estimated: 59 mL/min — ABNORMAL LOW (ref >60)
Glucose, Bld: 125 mg/dL — ABNORMAL HIGH (ref 70–99)
Potassium: 3.5 mmol/L (ref 3.5–5.1)
Sodium: 137 mmol/L (ref 135–145)
Total Bilirubin: 0.5 mg/dL (ref 0.3–1.2)
Total Protein: 6.6 g/dL (ref 6.5–8.1)

## 2021-09-03 LAB — CBC WITH DIFFERENTIAL/PLATELET
Abs Immature Granulocytes: 0.04 10*3/uL (ref 0.00–0.07)
Basophils Absolute: 0.1 10*3/uL (ref 0.0–0.1)
Basophils Relative: 1 %
Eosinophils Absolute: 0.3 10*3/uL (ref 0.0–0.5)
Eosinophils Relative: 3 %
HCT: 35.5 % — ABNORMAL LOW (ref 36.0–46.0)
Hemoglobin: 11 g/dL — ABNORMAL LOW (ref 12.0–15.0)
Immature Granulocytes: 0 %
Lymphocytes Relative: 8 %
Lymphs Abs: 0.9 10*3/uL (ref 0.7–4.0)
MCH: 26.1 pg (ref 26.0–34.0)
MCHC: 31 g/dL (ref 30.0–36.0)
MCV: 84.3 fL (ref 80.0–100.0)
Monocytes Absolute: 0.9 10*3/uL (ref 0.1–1.0)
Monocytes Relative: 8 %
Neutro Abs: 8.4 10*3/uL — ABNORMAL HIGH (ref 1.7–7.7)
Neutrophils Relative %: 80 %
Platelets: 334 10*3/uL (ref 150–400)
RBC: 4.21 MIL/uL (ref 3.87–5.11)
RDW: 19.9 % — ABNORMAL HIGH (ref 11.5–15.5)
WBC: 10.6 10*3/uL — ABNORMAL HIGH (ref 4.0–10.5)
nRBC: 0 % (ref 0.0–0.2)

## 2021-09-03 MED ORDER — HEPARIN SOD (PORK) LOCK FLUSH 100 UNIT/ML IV SOLN
500.0000 [IU] | Freq: Once | INTRAVENOUS | Status: DC
  Filled 2021-09-03: qty 5

## 2021-09-03 MED ORDER — PALONOSETRON HCL INJECTION 0.25 MG/5ML
0.2500 mg | Freq: Once | INTRAVENOUS | Status: AC
  Administered 2021-09-03: 0.25 mg via INTRAVENOUS
  Filled 2021-09-03: qty 5

## 2021-09-03 MED ORDER — FLUOROURACIL CHEMO INJECTION 2.5 GM/50ML
400.0000 mg/m2 | Freq: Once | INTRAVENOUS | Status: AC
  Administered 2021-09-03: 600 mg via INTRAVENOUS
  Filled 2021-09-03: qty 12

## 2021-09-03 MED ORDER — SODIUM CHLORIDE 0.9 % IV SOLN
Freq: Once | INTRAVENOUS | Status: AC
  Filled 2021-09-03: qty 250

## 2021-09-03 MED ORDER — SODIUM CHLORIDE 0.9% FLUSH
10.0000 mL | Freq: Once | INTRAVENOUS | Status: AC
  Administered 2021-09-03: 10 mL via INTRAVENOUS
  Filled 2021-09-03: qty 10

## 2021-09-03 MED ORDER — LEUCOVORIN CALCIUM INJECTION 350 MG
384.0000 mg/m2 | Freq: Once | INTRAVENOUS | Status: AC
  Administered 2021-09-03: 600 mg via INTRAVENOUS
  Filled 2021-09-03: qty 30

## 2021-09-03 MED ORDER — SODIUM CHLORIDE 0.9 % IV SOLN
10.0000 mg | Freq: Once | INTRAVENOUS | Status: AC
  Administered 2021-09-03: 10 mg via INTRAVENOUS
  Filled 2021-09-03: qty 10

## 2021-09-03 MED ORDER — FLUOROURACIL CHEMO INJECTION 5 GM/100ML
2400.0000 mg/m2 | INTRAVENOUS | Status: DC
  Administered 2021-09-03: 3750 mg via INTRAVENOUS
  Filled 2021-09-03: qty 75

## 2021-09-03 NOTE — Therapy (Signed)
[facility] [address] [place], [place], [place], [address] Phone: [phone]   Fax:  [phone]  Occupational Therapy Screen:  Patient Details  Name: Cheryl Baldwin MRN: [id number] Date of Birth: 15-Aug-1961 No data recorded  Encounter Date: 09/03/2021   OT End of Session - 09/03/21 2107     Visit Number 0             Past Medical History:  Diagnosis Date   Heart attack ([place])    Heart disease    heart attack - no stents   High cholesterol    Hx of blood clots    legs   Hypertension    Kidney disease    Stroke [facility])     Past Surgical History:  Procedure Laterality Date   APPENDECTOMY     LIVER BIOPSY     PORTA CATH INSERTION N/A 07/14/2021   Procedure: PORTA CATH INSERTION;  Surgeon: Algernon Huxley, MD;  Location: [facility] [facility];  Service: Cardiovascular;  Laterality: N/A;    There were no vitals filed for this visit.   Subjective Assessment - 09/03/21 2106     Subjective  I had stroke about 30 yrs ago and my R side was affected and then after my chemo started my R foot and leg got weaker -but now since started the Gabapentin - my foot better and neuropathy -feels like it is back to what it was before the chemo started - did not had any falls    Currently in Pain? No/denies               DR YU ASSESSMENT & PLAN on 09/03/21:  1. Neuropathy   2. Malignant neoplasm of ascending colon ([place])   3. Encounter for antineoplastic chemotherapy   4. 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 Liver metastasis ([place])     #Stage IV right-sided colon cancer with liver metastasis.   Foundation one testing showed NRAS wt, KRASQ61L, TMB 11 [high], MS stable, potential candidate for immunotherapy in future   Labs are reviewed and discussed with patient. Proceed with chemotherapy 5-FU.  CEA trended up.  This could be indicating disease activity versus reactive due to smoking. Plan to repeat CT  chest abdomen pelvis with contrast for evaluation of treatment response..  Consider adding additional agents, bevacizumab, panitumumab etc. Will not challenge her with oxaliplatin due to pre-existing neuropathy  Consider FOLFIRI.   #Right lower extremity pre-existing neuropathy, continue gabapentin.  Refer to neurology   #Liver cirrhosis, likely due to previous alcohol use.  Patient has had negative hepatitis panel in 2018 at  [facility]. Hep B core antibody, surface antibody, surface antigen were negative  Child Pugh A - 6 point.  I will defer management to GI   # IDA, iron panel showed decreased iron saturation.  Continue oral iron supplementation.    Supportive care measures are necessary for patient well-being and will be provided as necessary. We spent sufficient time to discuss many aspect of care, questions were answered to patient's satisfaction.    Follow-up in 2 weeks.  Lab MD 5-FU   OT SCREEN 09/03/21:  Pt refer to OT for screen because of R footdrop and increase neuropathy after chemo started. Pt report she had stroke 30 yrs ago and R side was affected. After chemo started R foot burned , had pain and got weaker -but she and sister report since started the Gabapentin - foot back to  what it was prior to chemo. Sister feels like she do circumflex hip maybe little more than before. Pt show this date AROM and place and hold of R ankle/foot dorsiflexion.  She is tight in calve with stretches into dorsiflexion. Denies any falls -and report being active and driving still. 2 steps to get in house- sister lives with her  Had tub shower combo at home and use rail or wall to step in and out.  No change in her activity level except not work at the moment.  HEP - pt to do calve stretches for R and then AROM and place and hold of R dorsiflexion of foot- Pt to do HEP for 2 wks and return for follow up                                           Visit  Diagnosis: Right foot drop    Problem List Patient Active Problem List   Diagnosis Date Noted   Alcoholic cirrhosis of liver without ascites ([place]) 07/04/2021   History of MI (myocardial infarction) 07/04/2021   Liver metastasis ([place]) 07/04/2021   Goals of care, counseling/discussion 07/03/2021   Malignant neoplasm of colon ([place]) 07/03/2021    Rosalyn Gess, OTR/L, CLT 09/03/2021, 9:08 PM  [facility] [address] [place], [place], [address] Phone: [phone]   Fax:  [phone]  Name: Cheryl Baldwin MRN: [id number] Date of Birth: 02-18-1961

## 2021-09-03 NOTE — Patient Instructions (Signed)
[facility]  Discharge Instructions: Thank you for choosing [facility] to provide your oncology and hematology care.  If you have a lab appointment with the [facility], please go directly to the [facility] and check in at the registration area.  Wear comfortable clothing and clothing appropriate for easy access to any Portacath or PICC line.   We strive to give you quality time with your provider. You may need to reschedule your appointment if you arrive late (15 or more minutes).  Arriving late affects you and other patients whose appointments are after yours.  Also, if you miss three or more appointments without notifying the office, you may be dismissed from the clinic at the provider's discretion.      For prescription refill requests, have your pharmacy contact our office and allow 72 hours for refills to be completed.    Today you received the following chemotherapy and/or immunotherapy agents Leucovorin & 5FU      To help prevent nausea and vomiting after your treatment, we encourage you to take your nausea medication as directed.  BELOW ARE SYMPTOMS THAT SHOULD BE REPORTED IMMEDIATELY: *FEVER GREATER THAN 100.4 F (38 C) OR HIGHER *CHILLS OR SWEATING *NAUSEA AND VOMITING THAT IS NOT CONTROLLED WITH YOUR NAUSEA MEDICATION *UNUSUAL SHORTNESS OF BREATH *UNUSUAL BRUISING OR BLEEDING *URINARY PROBLEMS (pain or burning when urinating, or frequent urination) *BOWEL PROBLEMS (unusual diarrhea, constipation, pain near the anus) TENDERNESS IN MOUTH AND THROAT WITH OR WITHOUT PRESENCE OF ULCERS (sore throat, sores in mouth, or a toothache) UNUSUAL RASH, SWELLING OR PAIN  UNUSUAL VAGINAL DISCHARGE OR ITCHING   Items with * indicate a potential emergency and should be followed up as soon as possible or go to the Emergency Department if any problems should occur.  Please show the CHEMOTHERAPY ALERT CARD or IMMUNOTHERAPY ALERT CARD at  check-in to the Emergency Department and triage nurse.  Should you have questions after your visit or need to cancel or reschedule your appointment, please contact [facility]  [phone] and follow the prompts.  Office hours are 8:00 a.m. to 4:30 p.m. Monday - Friday. Please note that voicemails left after 4:00 p.m. may not be returned until the following business day.  We are closed weekends and major holidays. You have access to a nurse at all times for urgent questions. Please call the main number to the clinic [phone] and follow the prompts.  For any non-urgent questions, you may also contact your provider using MyChart. We now offer e-Visits for anyone 46 and older to request care online for non-urgent symptoms. For details visit [email or web address].   Also download the MyChart app! Go to the app store, search "MyChart", open the app, select [facility], and log in with your MyChart username and password.  Due to Covid, a mask is required upon entering the hospital/clinic. If you do not have a mask, one will be given to you upon arrival. For doctor visits, patients may have 1 support person aged 8 or older with them. For treatment visits, patients cannot have anyone with them due to current Covid guidelines and our immunocompromised population.

## 2021-09-03 NOTE — Progress Notes (Signed)
Hematology/Oncology note [facility] [phone] [phone]   Patient Care Team: [facility] as PCP - General  REFERRING PROVIDER: [facility]  CHIEF COMPLAINTS/REASON FOR VISIT:  Follow up for stage IV colon cancer  HISTORY OF PRESENTING ILLNESS:   Cheryl Baldwin is a  61 y.o.  female with PMH listed below was seen in consultation at the request of  [place]  for evaluation of stage IV colon cancer.   June 2022.  Patient presented to primary care provider for evaluation of unintentional weight loss over the past 3 months.  04/30/2021, CT chest was obtained. CT shortness of breath ill-defined irregular hypoattenuated and hypoattenuated adrenal masses on the background of heterogeneous attenuation of the liver parenchyma. 5.5 cm soft tissue attenuation within the liver hilum.  Partially visualized linear metallic density within the soft tissue density at the hilum of the liver.  Splenomegaly, aortic atherosclerosis. Soft tissue/bronchoscopy nodules within the right lung.  Patient establish care with [facility] 05/23/2021. 06/06/2021 patient underwent MRI abdomen with and without contrast. Redemonstrated large peripheral arterial phase hyperenhancing mass in the right hepatic lobe.  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 Partially imaged large cecal mass seen.  06/24/2021, patient underwent ultrasound liver biopsy Pathology showed metastatic adenocarcinoma, consistent with spread from a colon primary.  Patient prefers to have oncology care closer to her home and was referred to establish care. Patient is a current everyday smoker.  She has a history of alcohol use.  Patient denies any regular alcohol use currently.  She drinks socially.  Denies nausea, vomiting, blood in the stool.   She lives with her sister Cheryl Baldwin who accompanied patient to today's visit.  Appetite is poor.  She has some right sided abdominal pressure discomfort.  History of stroke 30 years ago with residual right-sided weakness.  She takes Xarelto 10 mg daily.  She is able to ambulate independently.   History of MI several years ago.  Patient has a son who lives out of state.  Per patient and her sister, son is not much involved in patient's current life.  # History of MI [ age of 1] and Stroke [ age of 61], on Xarelto $RemoveBe'10mg'[id number]$  daily. Last Echo per PCP note was in 2011, 50% LVEF, result is not available in current EMR. 07/22/2021 2D echo showed LVEF 55%.   07/09/2021- 08/06/2021 5-FU treatment x 2 cycles with Dr.Rao  08/20/2021, patient switched her care back to me and received third cycle of 5-FU. 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     INTERVAL HISTORY Cheryl Baldwin is a 60 y.o. female who has above history reviewed by me today presents for follow up visit for management of stage IV colon cancer.  Patient denies any nausea vomiting diarrhea, abdominal pain.  Overall she tolerates 5-FU treatments very well. Right lower extremity pre-existing neuropathy symptoms has improved.  Patient is currently on gabapentin 200 mg nightly and 100 mg in a.m.  Review of Systems  Constitutional:  Positive for fatigue. Negative for appetite change, chills and fever.  HENT:   Negative for hearing loss and voice change.   Eyes:  Negative for eye problems.  Respiratory:  Negative for chest tightness and cough.   Cardiovascular:  Negative for chest pain.  Gastrointestinal:  Negative for abdominal distention and blood in stool.  Endocrine: Negative for hot flashes.  Genitourinary:  Negative for difficulty urinating and frequency.   Musculoskeletal:  Negative for arthralgias.  Skin:  Negative for itching and rash.  Neurological:  Positive for numbness. Negative for extremity weakness.        Chronic right side weakness due to previous stroke   Hematological:  Negative for adenopathy.  Psychiatric/Behavioral:  Negative for confusion.    MEDICAL HISTORY:  Past Medical History:  Diagnosis Date   Heart attack ([place])    Heart disease    heart attack - no stents   High cholesterol    Hx of blood clots    legs   Hypertension    Kidney disease    Stroke [facility])     SURGICAL HISTORY: Past Surgical History:  Procedure Laterality Date   APPENDECTOMY     LIVER BIOPSY     PORTA CATH INSERTION N/A 07/14/2021   Procedure: PORTA CATH INSERTION;  Surgeon: Algernon Huxley, MD;  Location: [place] [facility];  Service: Cardiovascular;  Laterality: N/A;    SOCIAL HISTORY: Social History   Socioeconomic History   Marital status: Divorced    Spouse name: Not on file   Number of children: Not on file   Years of education: Not on file   Highest education level: Not on file  Occupational History   Not on file  Tobacco Use   Smoking status: Every Day    Packs/day: 1.00    Years: 40.00    Pack years: 40.00    Types: Cigarettes   Smokeless tobacco: Never  Vaping Use   Vaping Use: Never used  Substance and Sexual Activity   Alcohol use: Not Currently    Comment: rarely   Drug use: No   Sexual activity: Not Currently  Other Topics Concern   Not on file  Social History Narrative   Not on file   Social Determinants of Health   Financial Resource Strain: Not on file  Food Insecurity: Not on file  Transportation Needs: Not on file  Physical Activity: Not on file  Stress: Not on file  Social Connections: Not on file  Intimate Partner Violence: Not on file    FAMILY HISTORY: Family History  Problem Relation Age of Onset   Other Mother        "old age"   Alzheimer's disease Mother    Heart attack Father    Breast cancer Paternal Aunt     ALLERGIES:  is allergic to penicillins.  MEDICATIONS:  Current Outpatient Medications  Medication Sig Dispense Refill    acetaminophen (TYLENOL) 500 MG tablet Take 500 mg by mouth every 6 (six) hours as needed.     albuterol (VENTOLIN HFA) 108 (90 Base) MCG/ACT inhaler Inhale 1-2 puffs into the lungs every 6 (six) hours as needed for wheezing or shortness of breath. 1 each 0   diazepam (VALIUM) 5 MG tablet Take by mouth.     fluticasone (FLONASE) 50 MCG/ACT nasal spray Place 2 sprays into both nostrils daily. 15.8 mL 0   gabapentin (NEURONTIN) 100 MG capsule Take 2 capsules (200 mg total) by mouth as directed. Take 2 capsules at night for 7 days and then increase to 2 capsules in am and 2 capsules at night 53 capsule 0   Iron-Vitamin C 65-125 MG TABS Take 1 tablet by mouth daily. 30 tablet 2   lidocaine-prilocaine (EMLA) cream Apply to affected area once 30 g 3   ondansetron (ZOFRAN) 8 MG tablet Take 1 tablet (8 mg total) by  mouth 2 (two) times daily as needed for refractory nausea / vomiting. Start on day 3 after chemotherapy. 30 tablet 1   prochlorperazine (COMPAZINE) 10 MG tablet Take 1 tablet (10 mg total) by mouth every 6 (six) hours as needed (Nausea or vomiting). 30 tablet 1   rivaroxaban (XARELTO) 10 MG TABS tablet Take 1 tablet by mouth daily with breakfast.     rosuvastatin (CRESTOR) 20 MG tablet Take 20 mg by mouth at bedtime.     traMADol (ULTRAM) 50 MG tablet Take 1 tablet (50 mg total) by mouth every 8 (eight) hours as needed for moderate pain or severe pain. 15 tablet 0   cyclobenzaprine (FLEXERIL) 10 MG tablet Take 1 tablet (10 mg total) by mouth 3 (three) times daily as needed for muscle spasms. (Patient not taking: No sig reported) 30 tablet 0   No current facility-administered medications for this visit.   Facility-Administered Medications Ordered in Other Visits  Medication Dose Route Frequency Provider Last Rate Last Admin   0.9 %  sodium chloride infusion   Intravenous Continuous Sindy Guadeloupe, MD   Stopped at 07/23/21 1107   0.9 %  sodium chloride infusion   Intravenous Continuous Sindy Guadeloupe, MD   Stopped at 08/06/21 1115   heparin lock flush 100 unit/mL  500 Units Intracatheter Once PRN Sindy Guadeloupe, MD         PHYSICAL EXAMINATION: ECOG PERFORMANCE STATUS: 1 - Symptomatic but completely ambulatory Vitals:   09/03/21 0842  BP: 117/70  Pulse: 93  Temp: 97.6 F (36.4 C)   Filed Weights   09/03/21 0842  Weight: 110 lb 14.4 oz (50.3 kg)    Physical Exam Constitutional:      General: She is not in acute distress.    Comments: Thin, ambulates independantly  HENT:     Head: Normocephalic and atraumatic.  Eyes:     General: No scleral icterus. Cardiovascular:     Rate and Rhythm: Normal rate and regular rhythm.     Heart sounds: Normal heart sounds.  Pulmonary:     Effort: Pulmonary effort is normal. No respiratory distress.     Breath sounds: No wheezing.  Abdominal:     General: Bowel sounds are normal. There is no distension.     Palpations: Abdomen is soft.  Musculoskeletal:        General: No deformity. Normal range of motion.     Cervical back: Normal range of motion and neck supple.  Skin:    General: Skin is warm and dry.     Findings: No erythema or rash.  Neurological:     Mental Status: She is alert and oriented to person, place, and time. Mental status is at baseline.     Cranial Nerves: No cranial nerve deficit.     Coordination: Coordination normal.     Comments: Decreased right dorsiflextion  Psychiatric:        Mood and Affect: Mood normal.    LABORATORY DATA:  I have reviewed the data as listed Lab Results  Component Value Date   WBC 10.6 (H) 09/03/2021   HGB 11.0 (L) 09/03/2021   HCT 35.5 (L) 09/03/2021   MCV 84.3 09/03/2021   PLT 334 09/03/2021   Recent Labs    08/06/21 0837 08/20/21 0841 09/03/21 0823  NA 137 137 137  K 3.5 3.5 3.5  CL 106 104 104  CO2 $Re'23 24 25  'EpD$ GLUCOSE 111* 103* 125*  BUN 23 17 19  CREATININE 1.17* 1.10* 1.07*  CALCIUM 8.8* 8.3* 8.9  GFRNONAA 53* 57* 59*  PROT 6.6 6.4* 6.6  ALBUMIN 3.4*  3.2* 3.4*  AST $Re'31 22 23  'Ado$ ALT $R'17 13 12  'Yr$ ALKPHOS 281* 236* 197*  BILITOT 0.9 0.9 0.5    Iron/TIBC/Ferritin/ %Sat    Component Value Date/Time   IRON 19 (L) 08/06/2021 0837   TIBC 302 08/06/2021 0837   FERRITIN 49 08/06/2021 0837   IRONPCTSAT 6 (L) 08/06/2021 0837      RADIOGRAPHIC STUDIES: I have personally reviewed the radiological images as listed and agreed with the findings in the report. No results found.    ASSESSMENT & PLAN:  1. Neuropathy   2. Malignant neoplasm of ascending colon ([place])   3. Encounter for antineoplastic chemotherapy   4. Alcoholic cirrhosis of liver without ascites ([place])   5. Liver metastasis ([place])    #Stage IV right-sided colon cancer with liver metastasis.   Foundation one testing showed NRAS wt, KRASQ61L, TMB 11 [high], MS stable, potential candidate for immunotherapy in future  Labs are reviewed and discussed with patient. Proceed with chemotherapy 5-FU.  CEA trended up.  This could be indicating disease activity versus reactive due to smoking. Plan to repeat CT chest abdomen pelvis with contrast for evaluation of treatment response..  Consider adding additional agents, bevacizumab, panitumumab etc. Will not challenge her with oxaliplatin due to pre-existing neuropathy  Consider FOLFIRI.  #Right lower extremity pre-existing neuropathy, continue gabapentin.  Refer to neurology  #Liver cirrhosis, likely due to previous alcohol use.  Patient has had negative hepatitis panel in 2018 at  [facility]. Hep B core antibody, surface antibody, surface antigen were negative  Child Pugh A - 6 point.  I will defer management to GI  # IDA, iron panel showed decreased iron saturation.  Continue oral iron supplementation.   Supportive care measures are necessary for patient well-being and will be provided as necessary. We spent sufficient time to discuss many aspect of care, questions were answered to patient's satisfaction.   Follow-up in 2 weeks.  Lab MD  5-FU Orders Placed This Encounter  Procedures   CT CHEST ABDOMEN PELVIS W CONTRAST    Standing Status:   Future    Standing Expiration Date:   09/03/2022    Order Specific Question:   Preferred imaging location?    Answer:   [facility]    Order Specific Question:   Is Oral Contrast requested for this exam?    Answer:   Yes, Per Radiology protocol    Order Specific Question:   Call Results- Best Contact Number?    Answer:   Mastatic Colon Cancer    Order Specific Question:   Reason for Exam (SYMPTOM  OR DIAGNOSIS REQUIRED)    Answer:   colon cancer   CT CHEST ABDOMEN PELVIS W CONTRAST    Standing Status:   Future    Standing Expiration Date:   09/03/2022    Order Specific Question:   Preferred imaging location?    Answer[facility]    Order Specific Question:   Is Oral Contrast requested for this exam?    Answer:   Yes, Per Radiology protocol    Order Specific Question:   Reason for Exam (SYMPTOM  OR DIAGNOSIS REQUIRED)    Answer:   colon cancer    All questions were answered. The patient knows to call the clinic with any problems questions or concerns.  cc [place]  Earlie Server, MD, PhD 09/03/2021

## 2021-09-04 ENCOUNTER — Other Ambulatory Visit: Payer: Self-pay

## 2021-09-04 ENCOUNTER — Ambulatory Visit: Payer: Medicare Other

## 2021-09-04 DIAGNOSIS — G629 Polyneuropathy, unspecified: Secondary | ICD-10-CM

## 2021-09-05 ENCOUNTER — Other Ambulatory Visit: Payer: Self-pay

## 2021-09-05 ENCOUNTER — Inpatient Hospital Stay: Payer: Medicare Other

## 2021-09-05 DIAGNOSIS — Z5111 Encounter for antineoplastic chemotherapy: Secondary | ICD-10-CM | POA: Diagnosis not present

## 2021-09-05 DIAGNOSIS — C182 Malignant neoplasm of ascending colon: Secondary | ICD-10-CM

## 2021-09-05 MED ORDER — HEPARIN SOD (PORK) LOCK FLUSH 100 UNIT/ML IV SOLN
INTRAVENOUS | Status: AC
  Filled 2021-09-05: qty 5

## 2021-09-05 MED ORDER — HEPARIN SOD (PORK) LOCK FLUSH 100 UNIT/ML IV SOLN
500.0000 [IU] | Freq: Once | INTRAVENOUS | Status: AC | PRN
  Administered 2021-09-05: 500 [IU]
  Filled 2021-09-05: qty 5

## 2021-09-05 MED ORDER — SODIUM CHLORIDE 0.9% FLUSH
10.0000 mL | INTRAVENOUS | Status: DC | PRN
  Administered 2021-09-05: 10 mL
  Filled 2021-09-05: qty 10

## 2021-09-05 MED ORDER — GABAPENTIN 100 MG PO CAPS
200.0000 mg | ORAL_CAPSULE | ORAL | 1 refills | Status: DC
Start: 2021-09-05 — End: 2022-03-09

## 2021-09-11 ENCOUNTER — Inpatient Hospital Stay (HOSPITAL_BASED_OUTPATIENT_CLINIC_OR_DEPARTMENT_OTHER): Payer: Medicare Other | Admitting: Hospice and Palliative Medicine

## 2021-09-11 DIAGNOSIS — C182 Malignant neoplasm of ascending colon: Secondary | ICD-10-CM

## 2021-09-11 NOTE — Progress Notes (Signed)
Virtual Visit via Telephone Note  I connected with Cheryl Baldwin on 09/11/21 at  2:00 PM EDT by a video enabled telemedicine application and verified that I am speaking with the correct person using two identifiers.  Location: Patient: Home Provider: Clinic   I discussed the limitations of evaluation and management by telemedicine and the availability of in person appointments. The patient expressed understanding and agreed to proceed.  History of Present Illness: Cheryl Baldwin is a 60 y.o. 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  Palliative care was consulted to help address goals and manage ongoing symptoms.   Observations/Objective: Spoke with patient's sister.  She reports that patient is doing reasonably well and denies any significant changes or concerns.  Neuropathy has reportedly improved on gabapentin.  Patient is scheduled to see Dr. Mickeal Skinner on 11/4.  Sister denies pain or other distressing symptoms at present.  Performance status is reportedly stable.  Appetite is adequate.  Weight also appears stable.  Assessment and Plan: Stage IV colon cancer -on FOLFOX plus Bev.  Seems to be doing well symptomatically.  Will follow  Follow Up Instructions: Follow-up MyChart visit 1 to 2 months   I discussed the assessment and treatment plan with the patient. The patient was provided an opportunity to ask questions and all were answered. The patient agreed with the plan and demonstrated an understanding of the instructions.   The patient was advised to call back or seek an in-person evaluation if the symptoms worsen or if the condition fails to improve as anticipated.  I provided 5 minutes of non-face-to-face time during this encounter.   [name], NP

## 2021-09-17 ENCOUNTER — Encounter: Payer: Self-pay | Admitting: Oncology

## 2021-09-17 ENCOUNTER — Inpatient Hospital Stay: Payer: Medicare Other

## 2021-09-17 ENCOUNTER — Other Ambulatory Visit: Payer: Self-pay

## 2021-09-17 ENCOUNTER — Ambulatory Visit: Payer: Medicare Other | Admitting: Oncology

## 2021-09-17 ENCOUNTER — Ambulatory Visit: Payer: Medicare Other

## 2021-09-17 ENCOUNTER — Inpatient Hospital Stay: Payer: Medicare Other | Admitting: Occupational Therapy

## 2021-09-17 ENCOUNTER — Other Ambulatory Visit: Payer: Medicare Other

## 2021-09-17 ENCOUNTER — Inpatient Hospital Stay: Payer: Medicare Other | Attending: Oncology | Admitting: Oncology

## 2021-09-17 VITALS — BP 104/67 | HR 93 | Temp 97.2°F | Resp 18 | Wt 112.1 lb

## 2021-09-17 DIAGNOSIS — Z5111 Encounter for antineoplastic chemotherapy: Secondary | ICD-10-CM

## 2021-09-17 DIAGNOSIS — C787 Secondary malignant neoplasm of liver and intrahepatic bile duct: Secondary | ICD-10-CM | POA: Diagnosis present

## 2021-09-17 DIAGNOSIS — M21371 Foot drop, right foot: Secondary | ICD-10-CM

## 2021-09-17 DIAGNOSIS — G629 Polyneuropathy, unspecified: Secondary | ICD-10-CM | POA: Diagnosis not present

## 2021-09-17 DIAGNOSIS — Z95828 Presence of other vascular implants and grafts: Secondary | ICD-10-CM

## 2021-09-17 DIAGNOSIS — K7030 Alcoholic cirrhosis of liver without ascites: Secondary | ICD-10-CM

## 2021-09-17 DIAGNOSIS — C182 Malignant neoplasm of ascending colon: Secondary | ICD-10-CM

## 2021-09-17 DIAGNOSIS — C7931 Secondary malignant neoplasm of brain: Secondary | ICD-10-CM | POA: Diagnosis not present

## 2021-09-17 LAB — COMPREHENSIVE METABOLIC PANEL
ALT: 14 U/L (ref 0–44)
AST: 21 U/L (ref 15–41)
Albumin: 3.5 g/dL (ref 3.5–5.0)
Alkaline Phosphatase: 154 U/L — ABNORMAL HIGH (ref 38–126)
Anion gap: 8 (ref 5–15)
BUN: 18 mg/dL (ref 8–23)
CO2: 26 mmol/L (ref 22–32)
Calcium: 8.7 mg/dL — ABNORMAL LOW (ref 8.9–10.3)
Chloride: 104 mmol/L (ref 98–111)
Creatinine, Ser: 1.22 mg/dL — ABNORMAL HIGH (ref 0.44–1.00)
GFR, Estimated: 50 mL/min — ABNORMAL LOW (ref >60)
Glucose, Bld: 98 mg/dL (ref 70–99)
Potassium: 3.8 mmol/L (ref 3.5–5.1)
Sodium: 138 mmol/L (ref 135–145)
Total Bilirubin: 0.7 mg/dL (ref 0.3–1.2)
Total Protein: 6.3 g/dL — ABNORMAL LOW (ref 6.5–8.1)

## 2021-09-17 LAB — CBC WITH DIFFERENTIAL/PLATELET
Abs Immature Granulocytes: 0.04 10*3/uL (ref 0.00–0.07)
Basophils Absolute: 0.1 10*3/uL (ref 0.0–0.1)
Basophils Relative: 1 %
Eosinophils Absolute: 0.3 10*3/uL (ref 0.0–0.5)
Eosinophils Relative: 3 %
HCT: 35.9 % — ABNORMAL LOW (ref 36.0–46.0)
Hemoglobin: 10.8 g/dL — ABNORMAL LOW (ref 12.0–15.0)
Immature Granulocytes: 0 %
Lymphocytes Relative: 10 %
Lymphs Abs: 1 10*3/uL (ref 0.7–4.0)
MCH: 25.8 pg — ABNORMAL LOW (ref 26.0–34.0)
MCHC: 30.1 g/dL (ref 30.0–36.0)
MCV: 85.9 fL (ref 80.0–100.0)
Monocytes Absolute: 0.7 10*3/uL (ref 0.1–1.0)
Monocytes Relative: 8 %
Neutro Abs: 7.4 10*3/uL (ref 1.7–7.7)
Neutrophils Relative %: 78 %
Platelets: 253 10*3/uL (ref 150–400)
RBC: 4.18 MIL/uL (ref 3.87–5.11)
RDW: 20.9 % — ABNORMAL HIGH (ref 11.5–15.5)
WBC: 9.6 10*3/uL (ref 4.0–10.5)
nRBC: 0 % (ref 0.0–0.2)

## 2021-09-17 MED ORDER — SODIUM CHLORIDE 0.9 % IV SOLN
10.0000 mg | Freq: Once | INTRAVENOUS | Status: AC
  Administered 2021-09-17: 10 mg via INTRAVENOUS
  Filled 2021-09-17: qty 10

## 2021-09-17 MED ORDER — SODIUM CHLORIDE 0.9% FLUSH
10.0000 mL | Freq: Once | INTRAVENOUS | Status: AC
  Administered 2021-09-17: 10 mL via INTRAVENOUS
  Filled 2021-09-17: qty 10

## 2021-09-17 MED ORDER — FLUOROURACIL CHEMO INJECTION 2.5 GM/50ML
400.0000 mg/m2 | Freq: Once | INTRAVENOUS | Status: AC
  Administered 2021-09-17: 600 mg via INTRAVENOUS
  Filled 2021-09-17: qty 12

## 2021-09-17 MED ORDER — PALONOSETRON HCL INJECTION 0.25 MG/5ML
0.2500 mg | Freq: Once | INTRAVENOUS | Status: AC
  Administered 2021-09-17: 0.25 mg via INTRAVENOUS
  Filled 2021-09-17: qty 5

## 2021-09-17 MED ORDER — SODIUM CHLORIDE 0.9 % IV SOLN
2400.0000 mg/m2 | INTRAVENOUS | Status: DC
  Administered 2021-09-17: 3750 mg via INTRAVENOUS
  Filled 2021-09-17: qty 75

## 2021-09-17 MED ORDER — SODIUM CHLORIDE 0.9 % IV SOLN
Freq: Once | INTRAVENOUS | Status: AC
  Filled 2021-09-17: qty 250

## 2021-09-17 MED ORDER — SODIUM CHLORIDE 0.9 % IV SOLN
INTRAVENOUS | Status: DC
  Filled 2021-09-17: qty 250

## 2021-09-17 MED ORDER — LEUCOVORIN CALCIUM INJECTION 350 MG
600.0000 mg | Freq: Once | INTRAVENOUS | Status: AC
  Administered 2021-09-17: 600 mg via INTRAVENOUS
  Filled 2021-09-17: qty 30

## 2021-09-17 NOTE — Progress Notes (Addendum)
Hematology/Oncology note [facility] Telephone:([phone] [phone]   Patient Care Team: [facility] as PCP - General  REFERRING PROVIDER: [facility]  CHIEF COMPLAINTS/REASON FOR VISIT:  Follow up for stage IV colon cancer  HISTORY OF PRESENTING ILLNESS:   Cheryl Baldwin is a  60 y.o.  female with PMH listed below was seen in consultation at the request of  Cheryl [facility], [facility]  for evaluation of stage IV colon cancer.   June 2022.  Patient presented to primary care provider for evaluation of unintentional weight loss over the past 3 months.  04/30/2021, CT chest was obtained. CT shortness of breath ill-defined irregular hypoattenuated and hypoattenuated adrenal masses on the background of heterogeneous attenuation of the liver parenchyma. 5.5 cm soft tissue attenuation within the liver hilum.  Partially visualized linear metallic density within the soft tissue density at the hilum of the liver.  Splenomegaly, aortic atherosclerosis. Soft tissue/bronchoscopy nodules within the right lung.  Patient establish care with [facility] 05/23/2021. 06/06/2021 patient underwent MRI abdomen with and without contrast. Redemonstrated large peripheral arterial phase hyperenhancing mass in the right hepatic lobe.  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 Partially imaged large cecal mass seen.  06/24/2021, patient underwent ultrasound liver biopsy Pathology showed metastatic adenocarcinoma, consistent with spread from a colon primary.  Patient prefers to have oncology care closer to her home and was referred to establish care. Patient is a current everyday smoker.  She has a history of alcohol use.  Patient denies any regular alcohol use currently.  She drinks socially.  Denies nausea, vomiting, blood in the stool.   She lives with her sister Cheryl Baldwin who accompanied patient to today's visit.  Appetite is poor.  She has some right sided abdominal pressure discomfort.  History of stroke 30 years ago with residual right-sided weakness.  She takes Xarelto 10 mg daily.  She is able to ambulate independently.   History of MI several years ago.  Patient has a son who lives out of state.  Per patient and her sister, son is not much involved in patient's current life.  # History of MI [ age of 73] and Stroke [ age of 45], on Xarelto 73m daily. Last Echo per PCP note was in 2011, 50% LVEF, result is not available in current EMR. 07/22/2021 2D echo showed LVEF 55%.   07/09/2021- 08/06/2021 5-FU treatment x 2 cycles with Dr.Rao  08/20/2021, patient switched her care back to me and received third cycle of 5-FU. 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     INTERVAL HISTORY DMaranatha Grossiis a 60y.o. female who has above history reviewed by me today presents for follow up visit for management of stage IV colon cancer.  Patient denies any nausea vomiting diarrhea, abdominal pain.  Overall she tolerates 5-FU treatments very well. Right lower extremity pre-existing neuropathy symptoms has improved, still has twitching sensation..  Patient is currently on gabapentin 200 mg nightly and 100 mg in a.m. patient has upcoming neurology appointment  Review of Systems  Constitutional:  Positive for fatigue. Negative for appetite change, chills and fever.  HENT:   Negative for hearing loss and voice change.   Eyes:  Negative for eye problems.  Respiratory:  Negative for chest tightness and cough.   Cardiovascular:  Negative for chest pain.  Gastrointestinal:  Negative for abdominal distention and blood  in stool.  Endocrine: Negative for hot flashes.  Genitourinary:  Negative for difficulty urinating and frequency.   Musculoskeletal:  Negative for arthralgias.  Skin:  Negative for itching and rash.   Neurological:  Positive for numbness. Negative for extremity weakness.       Chronic right side weakness due to previous stroke   Hematological:  Negative for adenopathy.  Psychiatric/Behavioral:  Negative for confusion.    MEDICAL HISTORY:  Past Medical History:  Diagnosis Date   Heart attack ([place])    Heart disease    heart attack - no stents   High cholesterol    Hx of blood clots    legs   Hypertension    Kidney disease    Stroke [facility])     SURGICAL HISTORY: Past Surgical History:  Procedure Laterality Date   APPENDECTOMY     LIVER BIOPSY     PORTA CATH INSERTION N/A 07/14/2021   Procedure: PORTA CATH INSERTION;  Surgeon: Algernon Huxley, MD;  Location: [place] [facility];  Service: Cardiovascular;  Laterality: N/A;    SOCIAL HISTORY: Social History   Socioeconomic History   Marital status: Divorced    Spouse name: Not on file   Number of children: Not on file   Years of education: Not on file   Highest education level: Not on file  Occupational History   Not on file  Tobacco Use   Smoking status: Every Day    Packs/day: 1.00    Years: 40.00    Pack years: 40.00    Types: Cigarettes   Smokeless tobacco: Never  Vaping Use   Vaping Use: Never used  Substance and Sexual Activity   Alcohol use: Not Currently    Comment: rarely   Drug use: No   Sexual activity: Not Currently  Other Topics Concern   Not on file  Social History Narrative   Not on file   Social Determinants of Health   Financial Resource Strain: Not on file  Food Insecurity: Not on file  Transportation Needs: Not on file  Physical Activity: Not on file  Stress: Not on file  Social Connections: Not on file  Intimate Partner Violence: Not on file    FAMILY HISTORY: Family History  Problem Relation Age of Onset   Other Mother        "old age"   Alzheimer's disease Mother    Heart attack Father    Breast cancer Paternal Aunt     ALLERGIES:  is allergic to  penicillins.  MEDICATIONS:  Current Outpatient Medications  Medication Sig Dispense Refill   acetaminophen (TYLENOL) 500 MG tablet Take 500 mg by mouth every 6 (six) hours as needed.     albuterol (VENTOLIN HFA) 108 (90 Base) MCG/ACT inhaler Inhale 1-2 puffs into the lungs every 6 (six) hours as needed for wheezing or shortness of breath. 1 each 0   diazepam (VALIUM) 5 MG tablet Take by mouth.     fluticasone (FLONASE) 50 MCG/ACT nasal spray Place 2 sprays into both nostrils daily. 15.8 mL 0   gabapentin (NEURONTIN) 100 MG capsule Take 2 capsules (200 mg total) by mouth as directed. Take 2 capsules at night,  1 capsule during daytime 90 capsule 1   Iron-Vitamin C 65-125 MG TABS Take 1 tablet by mouth daily. 30 tablet 2   lidocaine-prilocaine (EMLA) cream Apply to affected area once 30 g 3   ondansetron (ZOFRAN) 8 MG tablet Take 1 tablet (8 mg total) by mouth 2 (  two) times daily as needed for refractory nausea / vomiting. Start on day 3 after chemotherapy. 30 tablet 1   prochlorperazine (COMPAZINE) 10 MG tablet Take 1 tablet (10 mg total) by mouth every 6 (six) hours as needed (Nausea or vomiting). 30 tablet 1   rivaroxaban (XARELTO) 10 MG TABS tablet Take 1 tablet by mouth daily with breakfast.     rosuvastatin (CRESTOR) 20 MG tablet Take 20 mg by mouth at bedtime.     traMADol (ULTRAM) 50 MG tablet Take 1 tablet (50 mg total) by mouth every 8 (eight) hours as needed for moderate pain or severe pain. 15 tablet 0   cyclobenzaprine (FLEXERIL) 10 MG tablet Take 1 tablet (10 mg total) by mouth 3 (three) times daily as needed for muscle spasms. (Patient not taking: No sig reported) 30 tablet 0   No current facility-administered medications for this visit.   Facility-Administered Medications Ordered in Other Visits  Medication Dose Route Frequency Provider Last Rate Last Admin   0.9 %  sodium chloride infusion   Intravenous Continuous Sindy Guadeloupe, MD   Stopped at 07/23/21 1107   0.9 %  sodium  chloride infusion   Intravenous Continuous Sindy Guadeloupe, MD   Stopped at 08/06/21 1115   fluorouracil (ADRUCIL) 3,750 mg in sodium chloride 0.9 % 75 mL chemo infusion  2,400 mg/m2 (Treatment Plan Recorded) Intravenous 1 day or 1 dose Earlie Server, MD   3,750 mg at 09/17/21 1152   heparin lock flush 100 unit/mL  500 Units Intracatheter Once PRN Sindy Guadeloupe, MD         PHYSICAL EXAMINATION: ECOG PERFORMANCE STATUS: 1 - Symptomatic but completely ambulatory Vitals:   09/17/21 0851  BP: 104/67  Pulse: 93  Resp: 18  Temp: (!) 97.2 F (36.2 C)   Filed Weights   09/17/21 0851  Weight: 112 lb 1.6 oz (50.8 kg)    Physical Exam Constitutional:      General: She is not in acute distress.    Comments: Thin, ambulates independantly  HENT:     Head: Normocephalic and atraumatic.  Eyes:     General: No scleral icterus. Cardiovascular:     Rate and Rhythm: Normal rate and regular rhythm.     Heart sounds: Normal heart sounds.  Pulmonary:     Effort: Pulmonary effort is normal. No respiratory distress.     Breath sounds: No wheezing.  Abdominal:     General: Bowel sounds are normal. There is no distension.     Palpations: Abdomen is soft.  Musculoskeletal:        General: No deformity. Normal range of motion.     Cervical back: Normal range of motion and neck supple.  Skin:    General: Skin is warm and dry.     Findings: No erythema or rash.  Neurological:     Mental Status: She is alert and oriented to person, place, and time. Mental status is at baseline.     Cranial Nerves: No cranial nerve deficit.     Coordination: Coordination normal.     Comments: Decreased right dorsiflextion  Psychiatric:        Mood and Affect: Mood normal.    LABORATORY DATA:  I have reviewed the data as listed Lab Results  Component Value Date   WBC 9.6 09/17/2021   HGB 10.8 (L) 09/17/2021   HCT 35.9 (L) 09/17/2021   MCV 85.9 09/17/2021   PLT 253 09/17/2021   Recent Labs  08/20/21 0841  09/03/21 0823 09/17/21 0838  NA 137 137 138  K 3.5 3.5 3.8  CL 104 104 104  CO2 _0 GLUCOSE 103* 125* 98  BUN _1 CREATININE 1.10* 1.07* 1.22*  CALCIUM 8.3* 8.9 8.7*  GFRNONAA 57* 59* 50*  PROT 6.4* 6.6 6.3*  ALBUMIN 3.2* 3.4* 3.5  AST _2 ALT _3 ALKPHOS 236* 197* 154*  BILITOT 0.9 0.5 0.7    Iron/TIBC/Ferritin/ %Sat    Component Value Date/Time   IRON 19 (L) 08/06/2021 0837   TIBC 302 08/06/2021 0837   FERRITIN 49 08/06/2021 0837   IRONPCTSAT 6 (L) 08/06/2021 0837      RADIOGRAPHIC STUDIES: I have personally reviewed the radiological images as listed and agreed with the findings in the report. No results found.    ASSESSMENT & PLAN:  1. Malignant neoplasm of ascending colon ([place])   2. Encounter for antineoplastic chemotherapy   3. Neuropathy   4. Alcoholic cirrhosis of liver without ascites ([place])   5. Liver metastasis ([place])    #Stage IV right-sided colon cancer with liver metastasis.   Foundation one testing showed NRAS wt, KRASQ61L, TMB 11 [high], MS stable, potential candidate for immunotherapy in future CEA trended up.  This could be indicating disease activity versus reactive due to smoking. Labs are reviewed and discussed with patient. Proceed with 5-FU. Obtain MRI abdomen with and without contrast plus CT chest without contrast. If progression, consider adding FOLFIRI.  #Right lower extremity pre-existing neuropathy, continue gabapentin.  Refer to neurology  #Liver cirrhosis, likely due to previous alcohol use.  Patient has had negative hepatitis panel in 2018 at  [facility]. Hep B core antibody, surface antibody, surface antigen were negative  Child Pugh A - 6 point.  I will defer management to GI  #Chemotherapy-induced anemia, hemoglobin stable.  Monitor. # IDA, iron panel showed decreased iron saturation.  Continue oral iron supplementation.   # he denies influenza Supportive care measures are necessary for patient well-being  and will be provided as necessary. We spent sufficient time to discuss many aspect of care, questions were answered to patient's satisfaction.   Follow-up in 2 weeks.  Lab MD 5-FU Orders Placed This Encounter  Procedures   CT CHEST WO CONTRAST    Standing Status:   Future    Standing Expiration Date:   09/17/2022    Order Specific Question:   Preferred imaging location?    Answer:   [facility]   MR Abdomen W Wo Contrast    Standing Status:   Future    Standing Expiration Date:   09/17/2022    Order Specific Question:   If indicated for the ordered procedure, I authorize the administration of contrast media per Radiology protocol    Answer:   Yes    Order Specific Question:   What is the patient's sedation requirement?    Answer:   No Sedation    Order Specific Question:   Does the patient have a pacemaker or implanted devices?    Answer:   No    Order Specific Question:   Preferred imaging location?    Answer:   [facility] (table limit - 550lbs)    All questions were answered. The patient knows to call the clinic with any problems questions or concerns.  cc [place], [facility]    Earlie Server, MD, PhD 09/17/2021

## 2021-09-17 NOTE — Patient Instructions (Signed)
[facility]  Discharge Instructions: Thank you for choosing [facility] to provide your oncology and hematology care.  If you have a lab appointment with the [facility], please go directly to the [facility] and check in at the registration area.  Wear comfortable clothing and clothing appropriate for easy access to any Portacath or PICC line.   We strive to give you quality time with your provider. You may need to reschedule your appointment if you arrive late (15 or more minutes).  Arriving late affects you and other patients whose appointments are after yours.  Also, if you miss three or more appointments without notifying the office, you may be dismissed from the clinic at the provider's discretion.      For prescription refill requests, have your pharmacy contact our office and allow 72 hours for refills to be completed.    Today you received the following chemotherapy and/or immunotherapy agents LEUCOVORIN and 5 FU      To help prevent nausea and vomiting after your treatment, we encourage you to take your nausea medication as directed.  BELOW ARE SYMPTOMS THAT SHOULD BE REPORTED IMMEDIATELY: *FEVER GREATER THAN 100.4 F (38 C) OR HIGHER *CHILLS OR SWEATING *NAUSEA AND VOMITING THAT IS NOT CONTROLLED WITH YOUR NAUSEA MEDICATION *UNUSUAL SHORTNESS OF BREATH *UNUSUAL BRUISING OR BLEEDING *URINARY PROBLEMS (pain or burning when urinating, or frequent urination) *BOWEL PROBLEMS (unusual diarrhea, constipation, pain near the anus) TENDERNESS IN MOUTH AND THROAT WITH OR WITHOUT PRESENCE OF ULCERS (sore throat, sores in mouth, or a toothache) UNUSUAL RASH, SWELLING OR PAIN  UNUSUAL VAGINAL DISCHARGE OR ITCHING   Items with * indicate a potential emergency and should be followed up as soon as possible or go to the Emergency Department if any problems should occur.  Please show the CHEMOTHERAPY ALERT CARD or IMMUNOTHERAPY ALERT CARD at  check-in to the Emergency Department and triage nurse.  Should you have questions after your visit or need to cancel or reschedule your appointment, please contact [facility]  [phone] and follow the prompts.  Office hours are 8:00 a.m. to 4:30 p.m. Monday - Friday. Please note that voicemails left after 4:00 p.m. may not be returned until the following business day.  We are closed weekends and major holidays. You have access to a nurse at all times for urgent questions. Please call the main number to the clinic [phone] and follow the prompts.  For any non-urgent questions, you may also contact your provider using MyChart. We now offer e-Visits for anyone 52 and older to request care online for non-urgent symptoms. For details visit [email or web address].   Also download the MyChart app! Go to the app store, search "MyChart", open the app, select [facility], and log in with your MyChart username and password.  Due to Covid, a mask is required upon entering the hospital/clinic. If you do not have a mask, one will be given to you upon arrival. For doctor visits, patients may have 1 support person aged 10 or older with them. For treatment visits, patients cannot have anyone with them due to current Covid guidelines and our immunocompromised population.   Leucovorin injection What is this medication? LEUCOVORIN (loo koe VOR in) is used to prevent or treat the harmful effects of some medicines. This medicine is used to treat anemia caused by a low amount of folic acid in the body. It is also used with 5-fluorouracil (5-FU) to treat colon cancer.  This medicine may be used for other purposes; ask your health care provider or pharmacist if you have questions. What should I tell my care team before I take this medication? They need to know if you have any of these conditions: anemia from low levels of vitamin B-12 in the blood an unusual or allergic reaction  to leucovorin, folic acid, other medicines, foods, dyes, or preservatives pregnant or trying to get pregnant breast-feeding How should I use this medication? This medicine is for injection into a muscle or into a vein. It is given by a health care professional in a hospital or clinic setting. Talk to your pediatrician regarding the use of this medicine in children. Special care may be needed. Overdosage: If you think you have taken too much of this medicine contact a poison control center or emergency room at once. NOTE: This medicine is only for you. Do not share this medicine with others. What if I miss a dose? This does not apply. What may interact with this medication? capecitabine fluorouracil phenobarbital phenytoin primidone trimethoprim-sulfamethoxazole This list may not describe all possible interactions. Give your health care provider a list of all the medicines, herbs, non-prescription drugs, or dietary supplements you use. Also tell them if you smoke, drink alcohol, or use illegal drugs. Some items may interact with your medicine. What should I watch for while using this medication? Your condition will be monitored carefully while you are receiving this medicine. This medicine may increase the side effects of 5-fluorouracil, 5-FU. Tell your doctor or health care professional if you have diarrhea or mouth sores that do not get better or that get worse. What side effects may I notice from receiving this medication? Side effects that you should report to your doctor or health care professional as soon as possible: allergic reactions like skin rash, itching or hives, swelling of the face, lips, or tongue breathing problems fever, infection mouth sores unusual bleeding or bruising unusually weak or tired Side effects that usually do not require medical attention (report to your doctor or health care professional if they continue or are bothersome): constipation or diarrhea loss  of appetite nausea, vomiting This list may not describe all possible side effects. Call your doctor for medical advice about side effects. You may report side effects to FDA at 1-800-FDA-1088. Where should I keep my medication? This drug is given in a hospital or clinic and will not be stored at home. NOTE: This sheet is a summary. It may not cover all possible information. If you have questions about this medicine, talk to your doctor, pharmacist, or health care provider.  2022 Elsevier/Gold Standard (2008-05-08 16:50:29)  Fluorouracil, 5-FU injection What is this medication? FLUOROURACIL, 5-FU (flure oh YOOR a sil) is a chemotherapy drug. It slows the growth of cancer cells. This medicine is used to treat many types of cancer like breast cancer, colon or rectal cancer, pancreatic cancer, and stomach cancer. This medicine may be used for other purposes; ask your health care provider or pharmacist if you have questions. COMMON BRAND NAME(S): Adrucil What should I tell my care team before I take this medication? They need to know if you have any of these conditions: blood disorders dihydropyrimidine dehydrogenase (DPD) deficiency infection (especially a virus infection such as chickenpox, cold sores, or herpes) kidney disease liver disease malnourished, poor nutrition recent or ongoing radiation therapy an unusual or allergic reaction to fluorouracil, other chemotherapy, other medicines, foods, dyes, or preservatives pregnant or trying to get pregnant breast-feeding  How should I use this medication? This drug is given as an infusion or injection into a vein. It is administered in a hospital or clinic by a specially trained health care professional. Talk to your pediatrician regarding the use of this medicine in children. Special care may be needed. Overdosage: If you think you have taken too much of this medicine contact a poison control center or emergency room at once. NOTE: This  medicine is only for you. Do not share this medicine with others. What if I miss a dose? It is important not to miss your dose. Call your doctor or health care professional if you are unable to keep an appointment. What may interact with this medication? Do not take this medicine with any of the following medications: live virus vaccines This medicine may also interact with the following medications: medicines that treat or prevent blood clots like warfarin, enoxaparin, and dalteparin This list may not describe all possible interactions. Give your health care provider a list of all the medicines, herbs, non-prescription drugs, or dietary supplements you use. Also tell them if you smoke, drink alcohol, or use illegal drugs. Some items may interact with your medicine. What should I watch for while using this medication? Visit your doctor for checks on your progress. This drug may make you feel generally unwell. This is not uncommon, as chemotherapy can affect healthy cells as well as cancer cells. Report any side effects. Continue your course of treatment even though you feel ill unless your doctor tells you to stop. In some cases, you may be given additional medicines to help with side effects. Follow all directions for their use. Call your doctor or health care professional for advice if you get a fever, chills or sore throat, or other symptoms of a cold or flu. Do not treat yourself. This drug decreases your body's ability to fight infections. Try to avoid being around people who are sick. This medicine may increase your risk to bruise or bleed. Call your doctor or health care professional if you notice any unusual bleeding. Be careful brushing and flossing your teeth or using a toothpick because you may get an infection or bleed more easily. If you have any dental work done, tell your dentist you are receiving this medicine. Avoid taking products that contain aspirin, acetaminophen, ibuprofen,  naproxen, or ketoprofen unless instructed by your doctor. These medicines may hide a fever. Do not become pregnant while taking this medicine. Women should inform their doctor if they wish to become pregnant or think they might be pregnant. There is a potential for serious side effects to an unborn child. Talk to your health care professional or pharmacist for more information. Do not breast-feed an infant while taking this medicine. Men should inform their doctor if they wish to father a child. This medicine may lower sperm counts. Do not treat diarrhea with over the counter products. Contact your doctor if you have diarrhea that lasts more than 2 days or if it is severe and watery. This medicine can make you more sensitive to the sun. Keep out of the sun. If you cannot avoid being in the sun, wear protective clothing and use sunscreen. Do not use sun lamps or tanning beds/booths. What side effects may I notice from receiving this medication? Side effects that you should report to your doctor or health care professional as soon as possible: allergic reactions like skin rash, itching or hives, swelling of the face, lips, or tongue low blood counts -  this medicine may decrease the number of white blood cells, red blood cells and platelets. You may be at increased risk for infections and bleeding. signs of infection - fever or chills, cough, sore throat, pain or difficulty passing urine signs of decreased platelets or bleeding - bruising, pinpoint red spots on the skin, black, tarry stools, blood in the urine signs of decreased red blood cells - unusually weak or tired, fainting spells, lightheadedness breathing problems changes in vision chest pain mouth sores nausea and vomiting pain, swelling, redness at site where injected pain, tingling, numbness in the hands or feet redness, swelling, or sores on hands or feet stomach pain unusual bleeding Side effects that usually do not require medical  attention (report to your doctor or health care professional if they continue or are bothersome): changes in finger or toe nails diarrhea dry or itchy skin hair loss headache loss of appetite sensitivity of eyes to the light stomach upset unusually teary eyes This list may not describe all possible side effects. Call your doctor for medical advice about side effects. You may report side effects to FDA at 1-800-FDA-1088. Where should I keep my medication? This drug is given in a hospital or clinic and will not be stored at home. NOTE: This sheet is a summary. It may not cover all possible information. If you have questions about this medicine, talk to your doctor, pharmacist, or health care provider.  2022 Elsevier/Gold Standard (2019-10-03 15:00:03)

## 2021-09-17 NOTE — Therapy (Signed)
[facility] [address] [place], [place], [place], [address] Phone: [phone]   Fax:  [phone]  Occupational Therapy Screen:  Patient Details  Name: Cheryl Baldwin MRN: [id number] Date of Birth: 29-Oct-1961 No data recorded  Encounter Date: 09/17/2021   OT End of Session - 09/17/21 0823     Visit Number 0             Past Medical History:  Diagnosis Date   Heart attack ([facility])    Heart disease    heart attack - no stents   High cholesterol    Hx of blood clots    legs   Hypertension    Kidney disease    Stroke [facility])     Past Surgical History:  Procedure Laterality Date   APPENDECTOMY     LIVER BIOPSY     PORTA CATH INSERTION N/A 07/14/2021   Procedure: PORTA CATH INSERTION;  Surgeon: Algernon Huxley, MD;  Location: [facility];  Service: Cardiovascular;  Laterality: N/A;    There were no vitals filed for this visit.   Subjective Assessment - 09/17/21 0822     Subjective  I did not do the exercises - maybe few times - seeing the neurologist Friday- today is just busy day - don't think I am going to do the scan this afternoon    Currently in Pain? No/denies               DR YU ASSESSMENT & PLAN on 09/03/21:  1. Neuropathy   2. Malignant neoplasm of ascending colon ([place])   3. Encounter for antineoplastic chemotherapy   4. Alcoholic cirrhosis of liver without ascites ([place])   5. 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   Foundation one testing showed NRAS wt, KRASQ61L, TMB 11 [high], MS stable, potential candidate for immunotherapy in future   Labs are reviewed and discussed with patient. Proceed with chemotherapy 5-FU.  CEA trended up.  This could be indicating disease activity versus reactive due to smoking. Plan to repeat CT chest abdomen pelvis with contrast for evaluation of treatment response..  Consider adding additional agents,  bevacizumab, panitumumab etc. Will not challenge her with oxaliplatin due to pre-existing neuropathy  Consider FOLFIRI.   #Right lower extremity pre-existing neuropathy, continue gabapentin.  Refer to neurology   #Liver cirrhosis, likely due to previous alcohol use.  Patient has had negative hepatitis panel in 2018 at  [facility]. Hep B core antibody, surface antibody, surface antigen were negative  Child Pugh A - 6 point.  I will defer management to GI   # IDA, iron panel showed decreased iron saturation.  Continue oral iron supplementation.    Supportive care measures are necessary for patient well-being and will be provided as necessary. We spent sufficient time to discuss many aspect of care, questions were answered to patient's satisfaction.    Follow-up in 2 weeks.  Lab MD 5-FU    OT SCREEN 09/03/21:   Pt refer to OT for screen because of R footdrop and increase neuropathy after chemo started. Pt report she had stroke 30 yrs ago and R side was affected. After chemo started R foot burned , had pain and got weaker -but she and sister report since started the Gabapentin - foot back to what it was prior to chemo. Sister feels like she do circumflex hip maybe little more than before. Pt show this date AROM  and place and hold of R ankle/foot dorsiflexion.  She is tight in calve with stretches into dorsiflexion. Denies any falls -and report being active and driving still. 2 steps to get in house- sister lives with her  Had tub shower combo at home and use rail or wall to step in and out.  No change in her activity level except not work at the moment.  HEP - pt to do calve stretches for R and then AROM and place and hold of R dorsiflexion of foot- Pt to do HEP for 2 wks and return for follow up   OT SCREEN 09/17/21:   Pt return today for follow up - admit she did not do any exercises.  Report that her R footdrop and leg is back to what it was prior to chemo started. Her stroke was 30 yrs ago  and R side was affected. She and sister report that burning pain and foot  is better since she started  Gabapentin - foot back to what it was prior to chemo.  Pt cont to show some AROM and place and hold of R ankle/foot dorsiflexion.  She is tight in calve with stretches into dorsiflexion still. Denies any falls -and report being active and driving still. 2 steps to get in house- sister lives with her  Had tub shower combo at home and use rail or wall to step in and out.  No change in her activity level except not work at the moment.  HEP -  Pt and sister report pt did not do exercises - maybe 2-3 x  She did not follow thru in the past with rehab after stroke  Reinforce with pt importance of doing HEP - stretches to improve and maintain her dorsiflexion  2 x day pt to do calve stretches for R and then AROM and place and hold of R dorsiflexion of foot- Pt to follow up as needed  Do have appt with Neurologist on Friday - 09/19/21                                            Visit Diagnosis: Neuropathy  Right foot drop    Problem List Patient Active Problem List   Diagnosis Date Noted   Alcoholic cirrhosis of liver without ascites ([facility]) 07/04/2021   History of MI (myocardial infarction) 07/04/2021   Liver metastasis ([facility]) 07/04/2021   Goals of care, counseling/discussion 07/03/2021   Malignant neoplasm of colon ([facility]) 07/03/2021    Rosalyn Gess, OTR/L,CLT 09/17/2021, 8:24 AM  [facility] [address] [place], [place], [address] Phone: [phone]   Fax:  [phone]  Name: Cheryl Baldwin MRN: [id number] Date of Birth: 02-Nov-1961

## 2021-09-18 ENCOUNTER — Ambulatory Visit: Payer: Medicare Other

## 2021-09-19 ENCOUNTER — Inpatient Hospital Stay: Payer: Medicare Other

## 2021-09-19 ENCOUNTER — Other Ambulatory Visit: Payer: Self-pay

## 2021-09-19 ENCOUNTER — Inpatient Hospital Stay (HOSPITAL_BASED_OUTPATIENT_CLINIC_OR_DEPARTMENT_OTHER): Payer: Medicare Other | Admitting: Internal Medicine

## 2021-09-19 ENCOUNTER — Encounter: Payer: Self-pay | Admitting: Internal Medicine

## 2021-09-19 VITALS — BP 113/73 | HR 92 | Temp 97.1°F | Resp 16 | Wt 114.8 lb

## 2021-09-19 DIAGNOSIS — G629 Polyneuropathy, unspecified: Secondary | ICD-10-CM | POA: Diagnosis not present

## 2021-09-19 DIAGNOSIS — R569 Unspecified convulsions: Secondary | ICD-10-CM | POA: Diagnosis not present

## 2021-09-19 DIAGNOSIS — C182 Malignant neoplasm of ascending colon: Secondary | ICD-10-CM

## 2021-09-19 DIAGNOSIS — Z5111 Encounter for antineoplastic chemotherapy: Secondary | ICD-10-CM | POA: Diagnosis not present

## 2021-09-19 MED ORDER — LEVETIRACETAM 500 MG PO TABS: 500.0000 mg | ORAL_TABLET | Freq: Two times a day (BID) | ORAL | 3 refills | Status: DC

## 2021-09-19 MED ORDER — SODIUM CHLORIDE 0.9% FLUSH
10.0000 mL | INTRAVENOUS | Status: DC | PRN
  Administered 2021-09-19: 10 mL
  Filled 2021-09-19: qty 10

## 2021-09-19 MED ORDER — HEPARIN SOD (PORK) LOCK FLUSH 100 UNIT/ML IV SOLN
500.0000 [IU] | Freq: Once | INTRAVENOUS | Status: AC | PRN
  Administered 2021-09-19: 500 [IU]
  Filled 2021-09-19: qty 5

## 2021-09-19 NOTE — Progress Notes (Signed)
[facility] [place], [place] [address] [phone]   New Patient Evaluation  Date of Service: 09/19/21 Patient Name: Cheryl Baldwin Patient MRN: [id number] Patient DOB: 11/28/1960 Provider: Ventura Sellers, MD  Identifying Statement:  Cheryl Baldwin is a 60 y.o. female with Focal seizures ([place]) - Plan: MR BRAIN W WO CONTRAST  Neuropathy who presents for initial consultation and evaluation regarding cancer associated neurologic deficits.    Referring Provider: [facility] [address] [place],  [place] [address]  Primary Cancer:  Oncologic History: Oncology History  Malignant neoplasm of colon ([place])  07/03/2021 Initial Diagnosis   Malignant neoplasm of colon ([place])   07/03/2021 Cancer Staging   Staging form: Colon and Rectum - Neuroendocine Tumors, AJCC 8th Edition - Clinical stage from 07/03/2021: Stage IV (cTX, cNX, cM1) - Signed by Earlie Server, MD on 07/03/2021    07/23/2021 -  Chemotherapy   Patient is on Treatment Plan : COLORECTAL FOLFOX + Bevacizumab q14d       History of Present Illness: The patient's records from the referring physician were obtained and reviewed and the patient interviewed to confirm this HPI.  Cheryl Baldwin presents today for evaluation of recent neurologic episodes.  She describes several episodes, lasting several minutes, of numbness and tingling on aspects of her right foot and lower leg.  These are not associated with position, there is no pain, and no involvement of the left side at all.  This past week while awaiting infusion, she experienced rhythmic shaking of the entire right leg for 2-3 minutes, which was followed by 30 minutes of worsened right side weakness and mild confusion.  No recurrence of this episode.  She also describes less specific numbness affecting her right foot which is more constant in nature.  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  On 5-fu chemotherapy with Dr. Talbert Cage for colon cancer, s/p 4 cycles.  Medications: Current Outpatient Medications on File Prior to Visit  Medication Sig Dispense Refill   acetaminophen (TYLENOL) 500 MG tablet Take 500 mg by mouth every 6 (six) hours as needed.     albuterol (VENTOLIN HFA) 108 (90 Base) MCG/ACT inhaler Inhale 1-2 puffs into the lungs every 6 (six) hours as needed for wheezing or shortness of breath. 1 each 0   diazepam (VALIUM) 5 MG tablet Take by mouth.     fluticasone (FLONASE) 50 MCG/ACT nasal spray Place 2 sprays into both nostrils daily. 15.8 mL 0   gabapentin (NEURONTIN) 100 MG capsule Take 2 capsules (200 mg total) by mouth as directed. Take 2 capsules at night,  1 capsule during daytime 90 capsule 1   Iron-Vitamin C 65-125 MG TABS Take 1 tablet by mouth daily. 30 tablet 2   lidocaine-prilocaine (EMLA) cream Apply to affected area once 30 g 3   ondansetron (ZOFRAN) 8 MG tablet Take 1 tablet (8 mg total) by mouth 2 (two) times daily as needed for refractory nausea / vomiting. Start on day 3 after chemotherapy. 30 tablet 1   prochlorperazine (COMPAZINE) 10 MG tablet Take 1 tablet (10 mg total) by mouth every 6 (six) hours as needed (Nausea or vomiting). 30 tablet 1   rivaroxaban (XARELTO) 10 MG TABS tablet Take 1 tablet by mouth daily with breakfast.     rosuvastatin (CRESTOR) 20 MG tablet Take 20 mg by mouth at bedtime.     traMADol (ULTRAM) 50  MG tablet Take 1 tablet (50 mg total) by mouth every 8 (eight) hours as needed for moderate pain or severe pain. 15 tablet 0   cyclobenzaprine (FLEXERIL) 10 MG tablet Take 1 tablet (10 mg total) by mouth 3 (three) times daily as needed for muscle spasms. (Patient not taking: No sig reported) 30 tablet 0   Current Facility-Administered Medications on File Prior to Visit  Medication Dose Route Frequency Provider Last Rate Last Admin   0.9 %  sodium chloride infusion   Intravenous Continuous Sindy Guadeloupe, MD   Stopped at  07/23/21 1107   0.9 %  sodium chloride infusion   Intravenous Continuous Sindy Guadeloupe, MD   Stopped at 08/06/21 1115   heparin lock flush 100 unit/mL  500 Units Intracatheter Once PRN Sindy Guadeloupe, MD        Allergies:  Allergies  Allergen Reactions   Penicillins Shortness Of Breath    Elevates blood pressure Elevates blood pressure    Past Medical History:  Past Medical History:  Diagnosis Date   Heart attack ([place])    Heart disease    heart attack - no stents   High cholesterol    Hx of blood clots    legs   Hypertension    Kidney disease    Stroke [facility])    Past Surgical History:  Past Surgical History:  Procedure Laterality Date   APPENDECTOMY     LIVER BIOPSY     PORTA CATH INSERTION N/A 07/14/2021   Procedure: PORTA CATH INSERTION;  Surgeon: Algernon Huxley, MD;  Location: [facility];  Service: Cardiovascular;  Laterality: N/A;   Social History:  Social History   Socioeconomic History   Marital status: Divorced    Spouse name: Not on file   Number of children: Not on file   Years of education: Not on file   Highest education level: Not on file  Occupational History   Not on file  Tobacco Use   Smoking status: Every Day    Packs/day: 1.00    Years: 40.00    Pack years: 40.00    Types: Cigarettes   Smokeless tobacco: Never  Vaping Use   Vaping Use: Never used  Substance and Sexual Activity   Alcohol use: Not Currently    Comment: rarely   Drug use: No   Sexual activity: Not Currently  Other Topics Concern   Not on file  Social History Narrative   Not on file   Social Determinants of Health   Financial Resource Strain: Not on file  Food Insecurity: Not on file  Transportation Needs: Not on file  Physical Activity: Not on file  Stress: Not on file  Social Connections: Not on file  Intimate Partner Violence: Not on file   Family History:  Family History  Problem Relation Age of Onset   Other Mother        "old age"   Alzheimer's  disease Mother    Heart attack Father    Breast cancer Paternal Aunt     Review of Systems: Constitutional: Doesn't report fevers, chills or abnormal weight loss Eyes: Doesn't report blurriness of vision Ears, nose, mouth, throat, and face: Doesn't report sore throat Respiratory: Doesn't report cough, dyspnea or wheezes Cardiovascular: Doesn't report palpitation, chest discomfort  Gastrointestinal:  Doesn't report nausea, constipation, diarrhea GU: Doesn't report incontinence Skin: Doesn't report skin rashes Neurological: Per HPI Musculoskeletal: Doesn't report joint pain Behavioral/Psych: Doesn't report anxiety  Physical Exam:  Vitals:   09/19/21 0905  BP: 113/73  Pulse: 92  Resp: 16  Temp: (!) 97.1 F (36.2 C)   KPS: 70. General: Alert, cooperative, pleasant, in no acute distress Head: Normal EENT: No conjunctival injection or scleral icterus.  Lungs: Resp effort normal Cardiac: Regular rate Abdomen: Non-distended abdomen Skin: No rashes cyanosis or petechiae. Extremities: No clubbing or edema  Neurologic Exam: Mental Status: Awake, alert, attentive to examiner. Oriented to self and environment. Language is modestly impaired with regards to fluency and comprehension. Cranial Nerves: Visual acuity is grossly normal. Visual fields are full. Extra-ocular movements intact. No ptosis. Face is symmetric Motor: Spastic hemiparesis right arm and leg 4/5. No pathologic reflexes present.  Sensory: Intact to light touch Gait: Hemiparetic   Labs: I have reviewed the data as listed    Component Value Date/Time   NA 138 09/17/2021 0838   K 3.8 09/17/2021 0838   CL 104 09/17/2021 0838   CO2 26 09/17/2021 0838   GLUCOSE 98 09/17/2021 0838   BUN 18 09/17/2021 0838   CREATININE 1.22 (H) 09/17/2021 0838   CALCIUM 8.7 (L) 09/17/2021 0838   PROT 6.3 (L) 09/17/2021 0838   ALBUMIN 3.5 09/17/2021 0838   AST 21 09/17/2021 0838   ALT 14 09/17/2021 0838   ALKPHOS 154 (H) 09/17/2021  0838   BILITOT 0.7 09/17/2021 0838   GFRNONAA 50 (L) 09/17/2021 0838   Lab Results  Component Value Date   WBC 9.6 09/17/2021   NEUTROABS 7.4 09/17/2021   HGB 10.8 (L) 09/17/2021   HCT 35.9 (L) 09/17/2021   MCV 85.9 09/17/2021   PLT 253 09/17/2021    Imaging:  No CNS imaging available for review at this time   Assessment/Plan Focal seizures ([place]) - Plan: MR BRAIN W WO CONTRAST  Neuropathy  Cheryl Baldwin presents with clinical syndrome consistent with focal seizure(s), localizing to left pre-central gyrus.  It is possible/likely that the sensory episodes are also epileptic.    Etiology is likely left frontal gliosis from prior infarct, which was ~30 years ago from carotid dissection event.  We suspect that cancer and chemotherapy are acting as provoking agents in this case, although it is necessary to obtain CNS imaging to rule out metastatic process.    We will defer EEG at this time given confidence of diagnosis and localization.  Recommended treating seizure(s) with Keppra 500mg  BID.  Reviewed side effects, including fatigue, agitation.  Reviewed epilepsy safety as well at bedside.  We ask that Cheryl Baldwin return to clinic in 2 weeks via phone following brain MRI, or sooner as needed.  We spent twenty additional minutes teaching regarding the natural history, biology, and historical experience in the treatment of neurologic complications of cancer.   We appreciate the opportunity to participate in the care of Cheryl Baldwin.   All questions were answered. The patient knows to call the clinic with any problems, questions or concerns. No barriers to learning were detected.  The total time spent in the encounter was 40 minutes and more than 50% was on counseling and review of test results   Ventura Sellers, MD Medical Director of Neuro-Oncology [facility] at [place] 09/19/21 9:46 AM

## 2021-09-19 NOTE — Progress Notes (Signed)
New patient evaluation with Dr. Mickeal Skinner today.

## 2021-09-25 ENCOUNTER — Ambulatory Visit
Admission: RE | Admit: 2021-09-25 | Discharge: 2021-09-25 | Disposition: A | Discharge status: Home / Self Care | Payer: Medicare Other | Source: Ambulatory Visit | Attending: Oncology | Admitting: Oncology

## 2021-09-25 ENCOUNTER — Other Ambulatory Visit: Payer: Self-pay

## 2021-09-25 DIAGNOSIS — C182 Malignant neoplasm of ascending colon: Secondary | ICD-10-CM | POA: Insufficient documentation

## 2021-09-25 IMAGING — CT CT CHEST-ABD-PELV W/ CM
2 of 5 series · 12 of 36 positions shown, 14 images · IV contrast (omnipaque)
Comparison: None.

CLINICAL DATA: Colon cancer.  Restaging.

EXAM:
CT CHEST, ABDOMEN, AND PELVIS WITH CONTRAST
TECHNIQUE: Multidetector CT imaging of the chest, abdomen and pelvis was
performed following the standard protocol during bolus
administration of intravenous contrast.
CONTRAST:  80mL OMNIPAQUE IOHEXOL 300 MG/ML  SOLN

[Series 2: cap with · axial · 0.65mm/px · z∈[-611,-91]mm · 9 of 130 slices shown, 11 images]
[im 13/130  mediastinal]
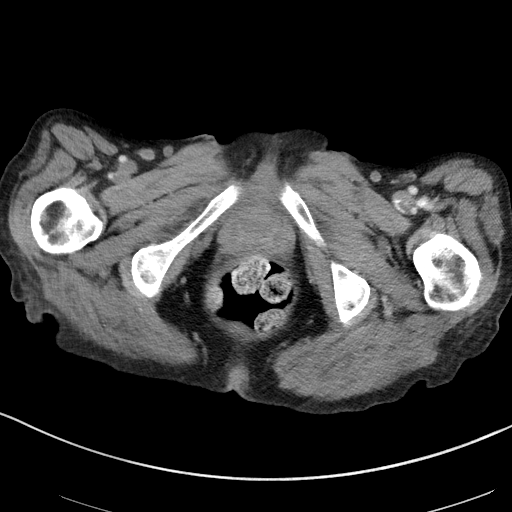
[im 13/130  bone]
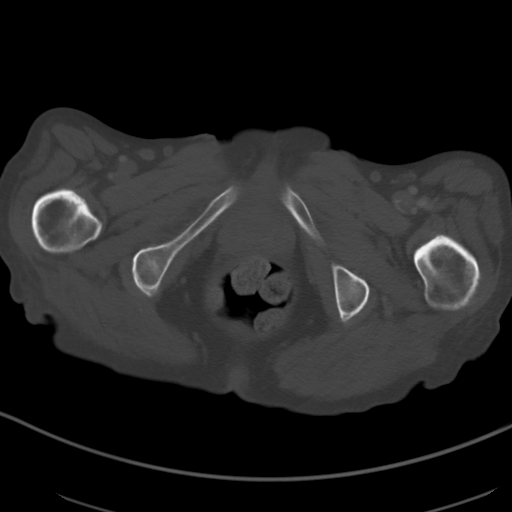
[im 26/130  mediastinal]
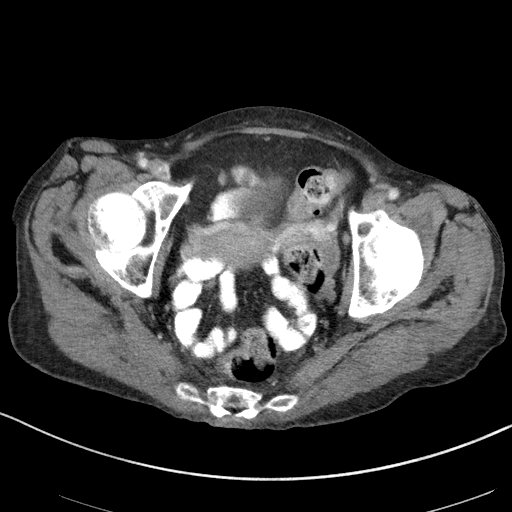
[im 39/130  mediastinal]
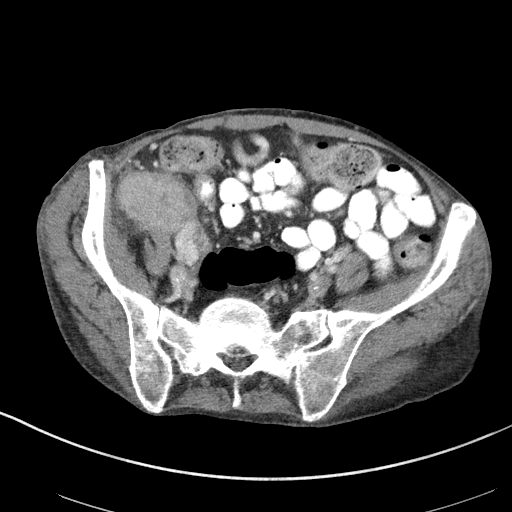
[im 52/130  mediastinal]
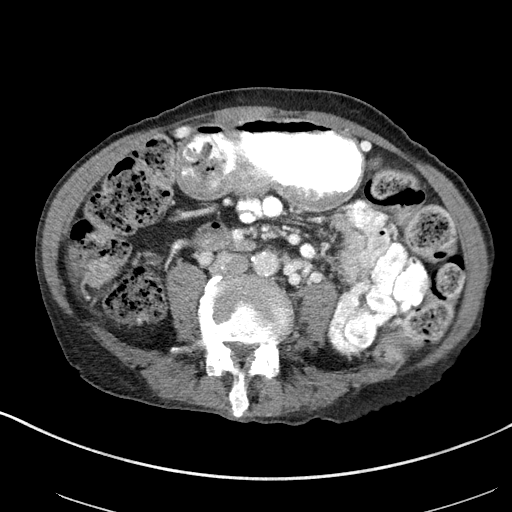
[im 65/130  mediastinal]
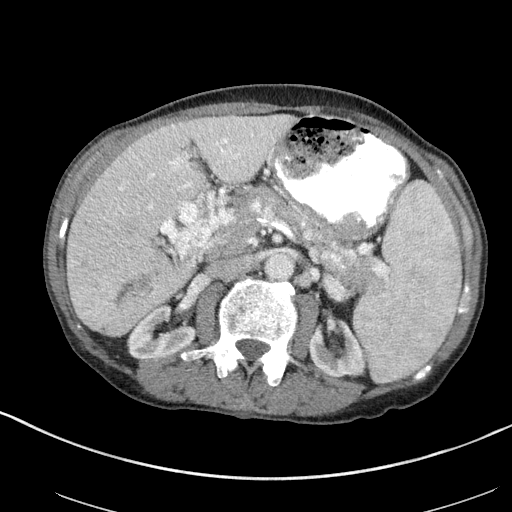
[im 78/130  mediastinal]
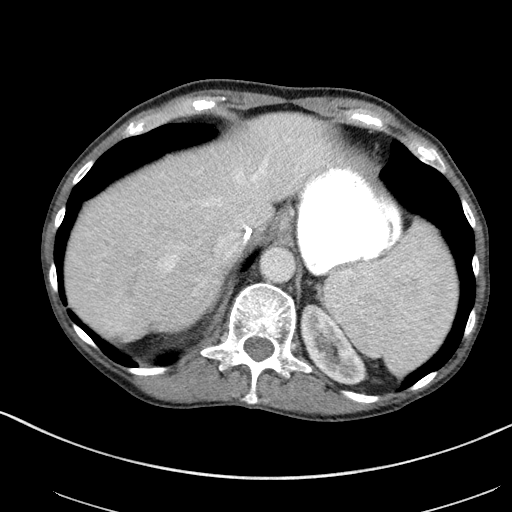
[im 91/130  mediastinal]
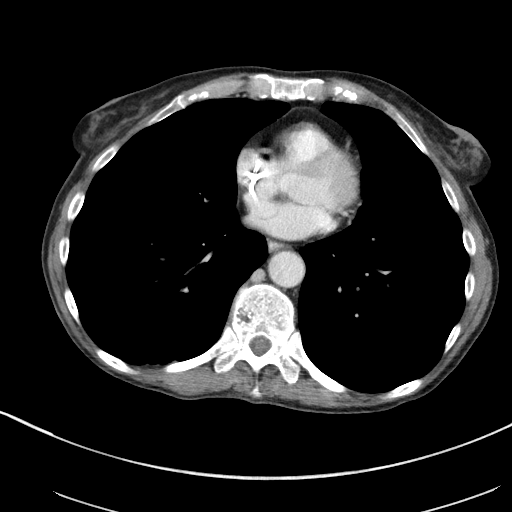
[im 104/130  mediastinal]
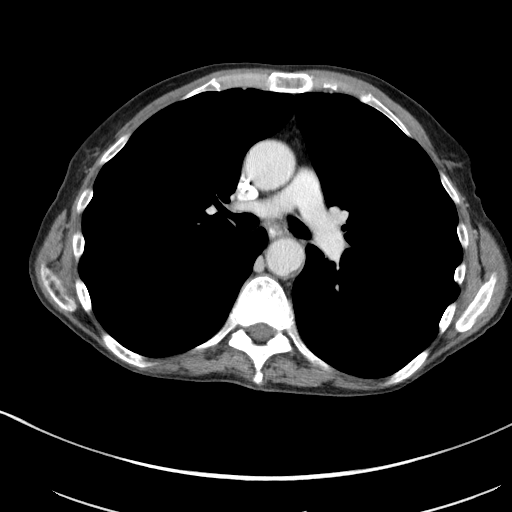
[im 117/130  mediastinal]
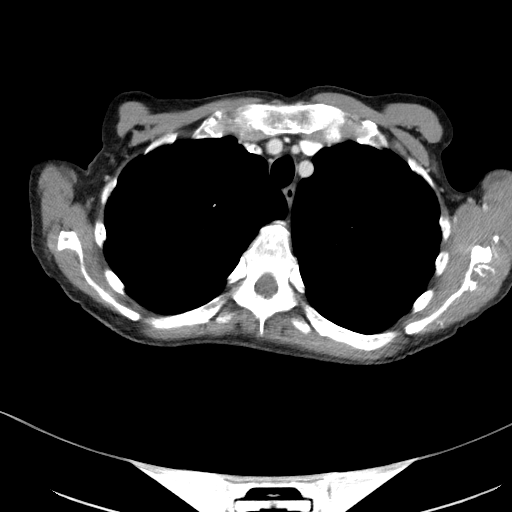
[im 117/130  bone]
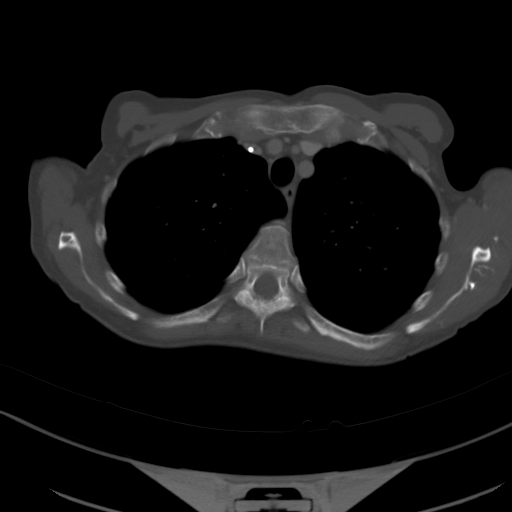

[Series 5: coronals · coronal · 0.65mm/px · 3 of 124 slices shown]
[im 25/124  mediastinal]
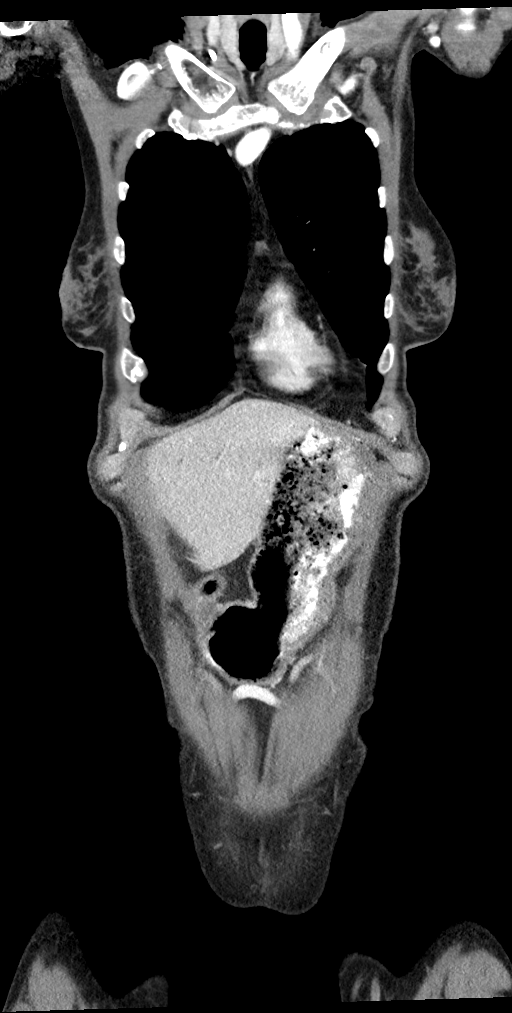
[im 50/124  mediastinal]
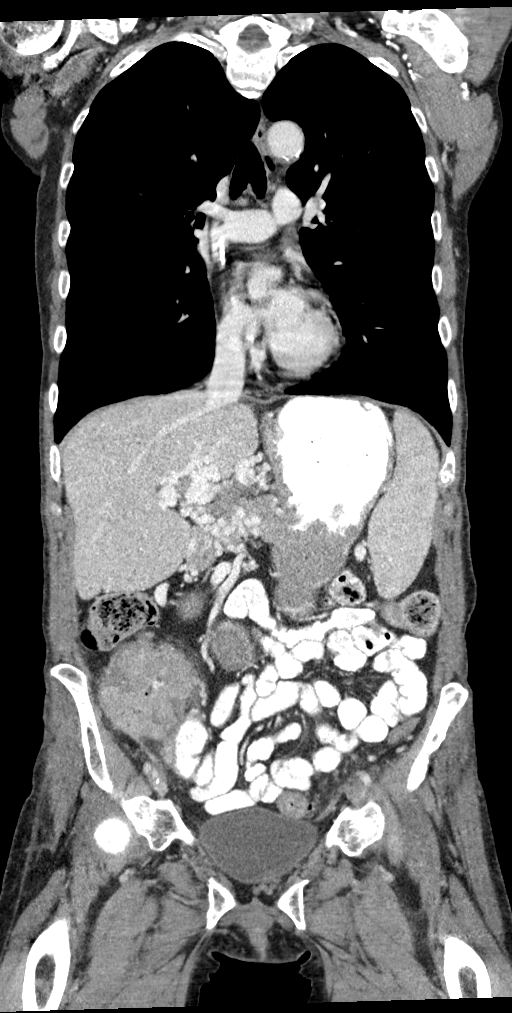
[im 74/124  mediastinal]
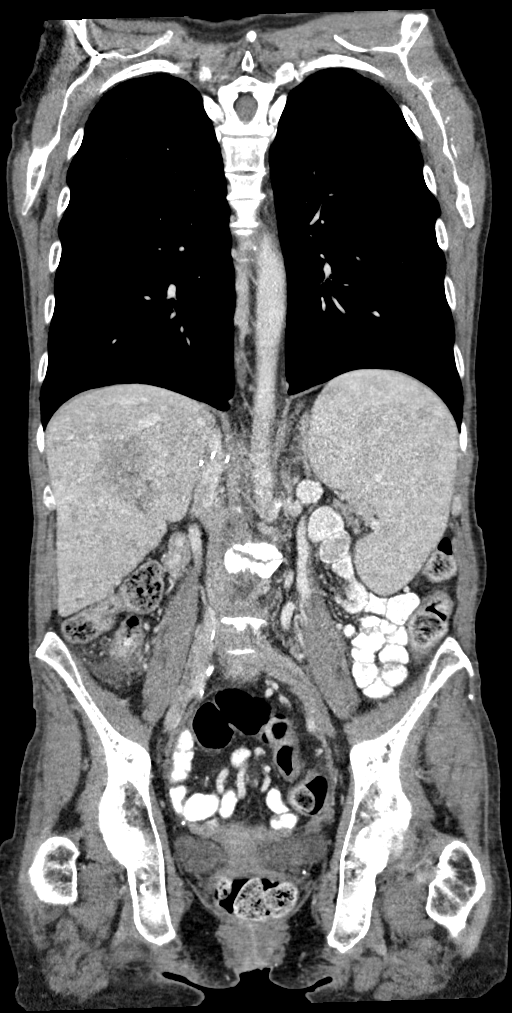

[12 of 36 positions shown; findings below may reference images not displayed]

FINDINGS: CT CHEST FINDINGS

Cardiovascular: The heart size is normal. No substantial pericardial
effusion. Coronary artery calcification is evident. Mild
atherosclerotic calcification is noted in the wall of the thoracic
aorta. Right Port-A-Cath tip is positioned in the mid right atrium.

Mediastinum/Nodes: No mediastinal lymphadenopathy. There is no hilar
lymphadenopathy. The esophagus has normal imaging features.
Paraesophageal varices evident. There is no axillary
lymphadenopathy.

Lungs/Pleura: 4 mm right upper lobe pulmonary nodule identified on
image [DATE], measured at 3 mm previously. 4 mm right lower lobe
nodule on 83/4 is stable. 2 mm left upper lobe nodule on [DATE] is
unchanged. No new suspicious pulmonary nodule or mass. No focal
airspace consolidation. No pleural effusion.

Musculoskeletal: No worrisome lytic or sclerotic osseous
abnormality. Mild compression deformity noted at T5 and T7.

CT ABDOMEN PELVIS FINDINGS

Hepatobiliary: Subtle nodularity of liver contour evident. 5.4 x
cm irregular heterogeneously enhancing mass again identified
posterior right liver. Outside MRI from [facility] dated [DATE] is
available. Measuring this lesion at a similar level and in the same
dimensions, the mass was 4.5 x 4.2 cm on the previous study.
Ill-defined margins in irregular shape make reproducible measurement
difficult. Mild intrahepatic biliary duct dilatation again noted.
Gallbladder is nondistended.

Pancreas: No focal mass lesion. No dilatation of the main duct. No
intraparenchymal cyst. No peripancreatic edema.

Spleen: Spleen is enlarged at 14.5 cm craniocaudal length.

Adrenals/Urinary Tract: No adrenal nodule or mass. Kidneys
unremarkable No evidence for hydroureter. The urinary bladder
appears normal for the degree of distention.

Stomach/Bowel: Stomach is moderately distended with food and
contrast material. Duodenum is normally positioned as is the
ligament of [place]. No small bowel wall thickening. No small bowel
dilatation. Marked wall thickening is seen in the cecum, involving
the ileocecal valve and extending distal to the level of the
ileocecal valve. Colonic wall is ill-defined in there is a
finger-like projection of soft tissue extending into the pericolonic
fat (image 86/2) suggesting direct transmural tumor extension. No
evidence for colonic obstruction.

Vascular/Lymphatic: There is moderate atherosclerotic calcification
of the abdominal aorta without aneurysm. IVC filter visualized in
situ ill-defined low-density soft tissue in the hepatoduodenal
ligament/porta hepatis is probably necrotic lymphadenopathy. Chronic
portal vein occlusion evident with recanalization of the
paraumbilical vein and cavernous transformation in the porta
hepatis. There is extensive venous collateralization in the upper
abdomen. No retroperitoneal lymphadenopathy. No pelvic sidewall
lymphadenopathy.

Reproductive: The uterus is unremarkable.  There is no adnexal mass.

Other: Trace free fluid noted in the cul-de-sac.

Musculoskeletal: No worrisome lytic or sclerotic osseous
abnormality. Sclerotic lesion in the sacrum likely a bone island.
IMPRESSION: 1. Large right colonic mass involving the cecum, ileocecal valve,
and ascending colon distal to the ileocecal valve. There is a
finger-like projection of soft tissue into the medial Peri cecal fat
suggesting transmural tumor spread.
2. Ill-defined soft tissue in the porta hepatis likely related to
necrotic metastatic lymphadenopathy.
3. Heterogeneous ill-defined mass in the posterior right liver
consistent with metastatic disease. This measures slightly larger
than on outside MRI of [DATE].
[DATE]. Chronic occlusion of the portal vein with cavernous
transformation in the porta hepatis, recanalization of the
paraumbilical vein, and splenomegaly indicating portal venous
hypertension.
5. Scattered tiny pulmonary nodules. Close attention on follow-up
recommended to exclude metastatic disease.
6. Trace free fluid in the pelvis.

## 2021-09-25 MED ORDER — IOHEXOL 300 MG/ML  SOLN
80.0000 mL | Freq: Once | INTRAMUSCULAR | Status: AC | PRN
  Administered 2021-09-25: 80 mL via INTRAVENOUS

## 2021-09-29 ENCOUNTER — Other Ambulatory Visit: Payer: Self-pay

## 2021-09-29 ENCOUNTER — Ambulatory Visit
Admission: RE | Admit: 2021-09-29 | Discharge: 2021-09-29 | Disposition: A | Discharge status: Home / Self Care | Payer: Medicare Other | Source: Ambulatory Visit | Attending: Oncology | Admitting: Oncology

## 2021-09-29 ENCOUNTER — Ambulatory Visit
Admission: RE | Admit: 2021-09-29 | Discharge: 2021-09-29 | Disposition: A | Discharge status: Home / Self Care | Payer: Medicare Other | Source: Ambulatory Visit | Attending: Internal Medicine | Admitting: Internal Medicine

## 2021-09-29 ENCOUNTER — Other Ambulatory Visit: Payer: Self-pay | Admitting: Oncology

## 2021-09-29 DIAGNOSIS — R569 Unspecified convulsions: Secondary | ICD-10-CM | POA: Insufficient documentation

## 2021-09-29 DIAGNOSIS — C182 Malignant neoplasm of ascending colon: Secondary | ICD-10-CM | POA: Diagnosis present

## 2021-09-29 IMAGING — MR MR ABDOMEN WO/W CM
18 series · 48 of 48 positions shown · IV contrast (gadavist)
Comparison: CT on [DATE], and MRI from due on [DATE]

CLINICAL DATA: Colon carcinoma.

EXAM:
MRI ABDOMEN WITHOUT AND WITH CONTRAST
TECHNIQUE: Multiplanar multisequence MR imaging of the abdomen was performed
both before and after the administration of intravenous contrast.
CONTRAST:  6mL GADAVIST GADOBUTROL 1 MMOL/ML IV SOLN

[Series 3: T2 · coronal · 6.0mm · 1.19mm/px · 2 of 30 slices shown (1 of 2)]
[im 1/30]
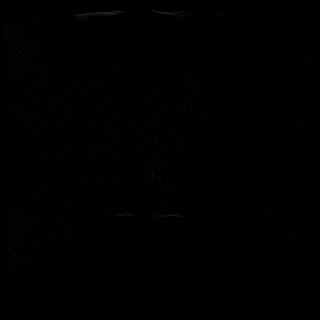
[im 30/30]
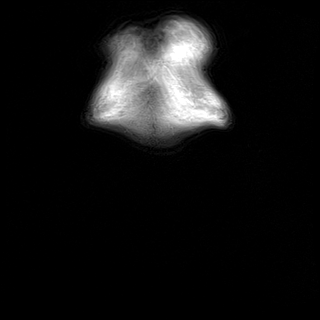

[Series 4: T2 · axial · 6.5mm · 1.19mm/px · z∈[-592,-350]mm · 2 of 32 slices shown (2 of 2)]
[im 1/32]
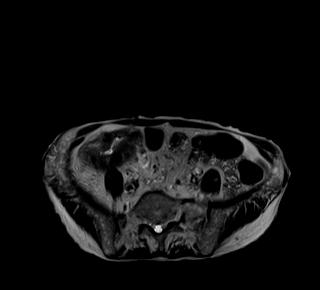
[im 32/32]
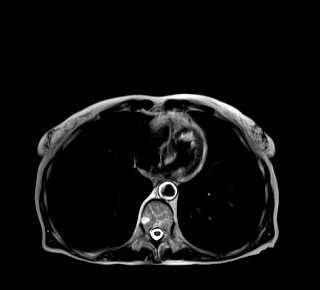

[Series 6: T2 fat-sat · axial · 6.5mm · 1.09mm/px · z∈[-585,-343]mm · 2 of 32 slices shown]
[im 1/32]
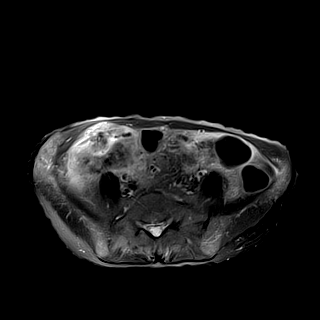
[im 32/32]
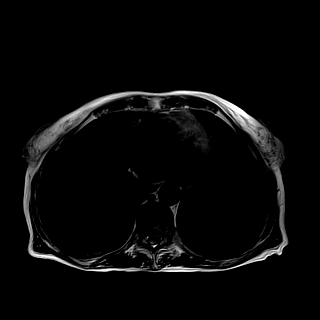

[Series 7: ax dwi_tracew · axial · 6.5mm · 1.31mm/px · z∈[-585,-343]mm · 4 of 94 slices shown]
[im 1/94]
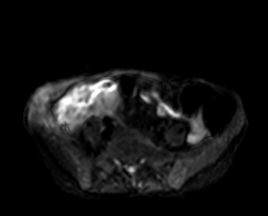
[im 32/94]
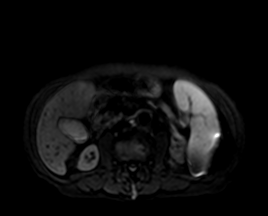
[im 63/94]
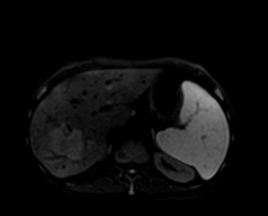
[im 94/94]
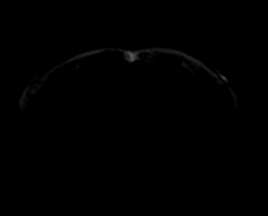

[Series 8: ax dwi_adc · axial · 6.5mm · 1.31mm/px · 1 of 32 slices shown]
[im 1/32]
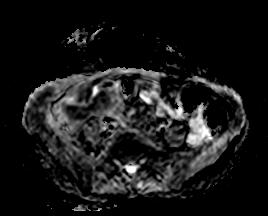

[Series 9: T1 · axial · 3.0mm · 1.09mm/px · z∈[-589,-352]mm · 3 of 80 slices shown (1 of 2)]
[im 1/80]
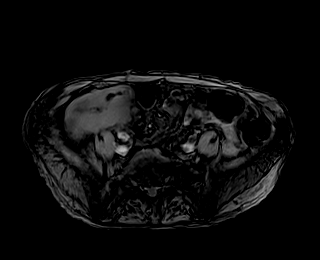
[im 40/80]
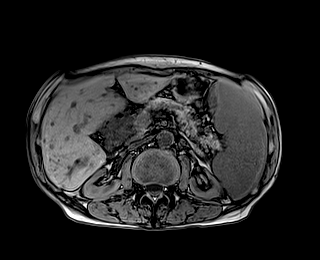
[im 80/80]
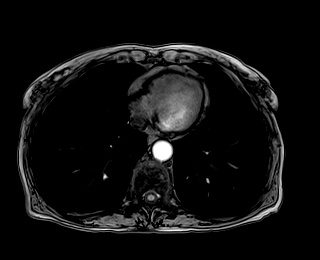

[Series 10: T1 · axial · 3.0mm · 1.09mm/px · z∈[-589,-352]mm · 3 of 80 slices shown (2 of 2)]
[im 1/80]
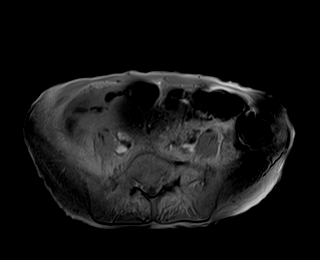
[im 40/80]
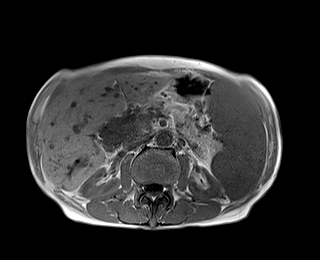
[im 80/80]
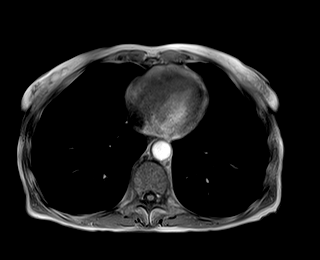

[Series 11: bSSFP · axial · 6.5mm · 0.68mm/px · 1 of 32 slices shown]
[im 1/32]
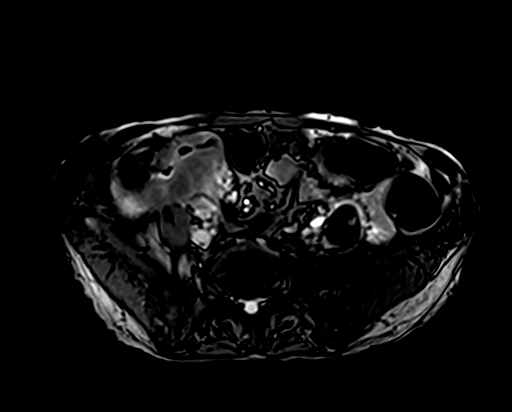

[Series 12: T1 dynamic fat-sat · axial · non-contrast · 3.0mm · 1.09mm/px · z∈[-601,-340]mm · 3 of 88 slices shown (1 of 5)]
[im 1/88]
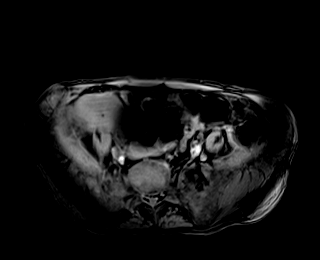
[im 44/88]
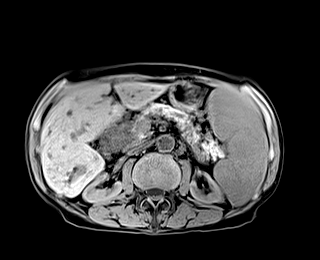
[im 88/88]
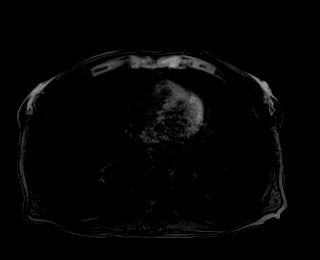

[Series 13: T1 dynamic fat-sat post-contrast · axial · 3.0mm · 1.09mm/px · z∈[-601,-340]mm · 3 of 88 slices shown (1 of 4)]
[im 1/88]
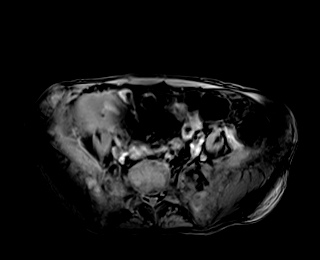
[im 44/88]
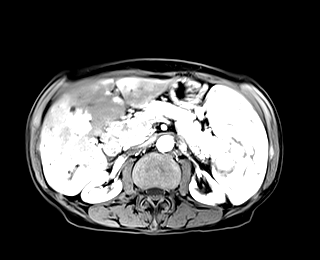
[im 88/88]
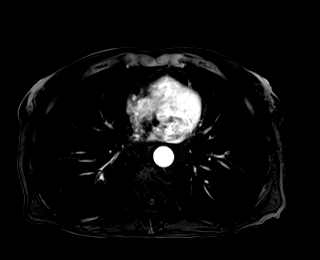

[Series 14: T1 dynamic fat-sat · axial · 3.0mm · 1.09mm/px · z∈[-601,-340]mm · 3 of 88 slices shown (2 of 5)]
[im 1/88]
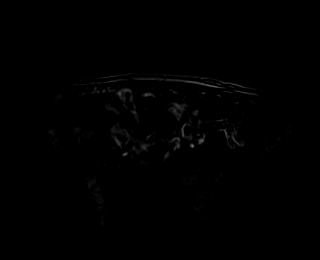
[im 44/88]
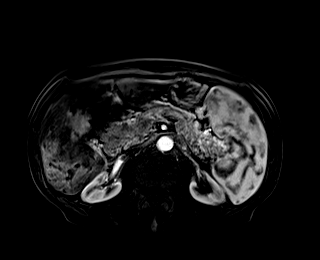
[im 88/88]
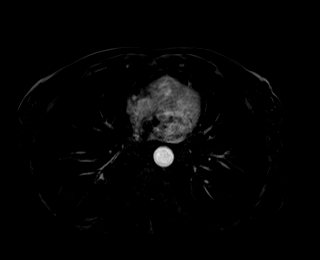

[Series 15: T1 dynamic fat-sat post-contrast · axial · 3.0mm · 1.09mm/px · z∈[-601,-340]mm · 3 of 88 slices shown (2 of 4)]
[im 1/88]
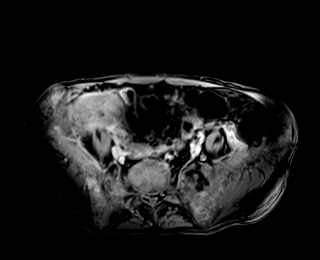
[im 44/88]
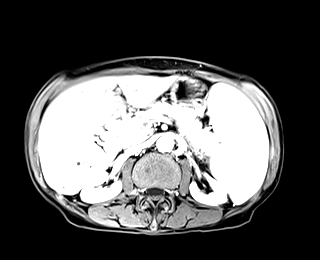
[im 88/88]
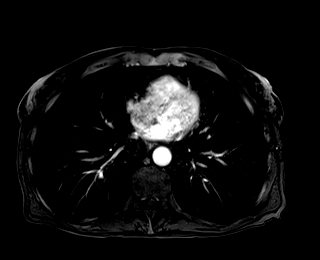

[Series 16: T1 dynamic fat-sat · axial · 3.0mm · 1.09mm/px · z∈[-601,-340]mm · 3 of 88 slices shown (3 of 5)]
[im 1/88]
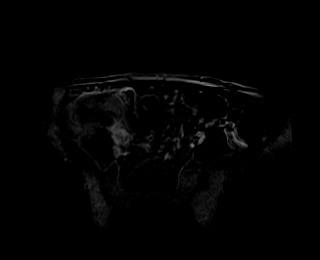
[im 44/88]
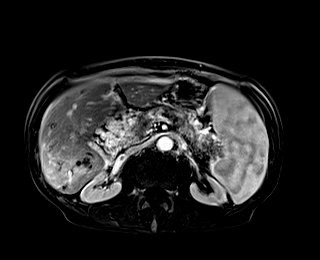
[im 88/88]
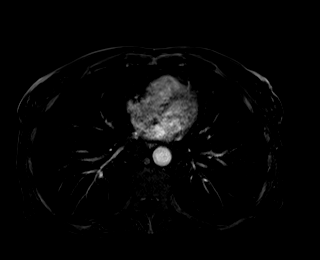

[Series 17: T1 dynamic fat-sat post-contrast · axial · 3.0mm · 1.09mm/px · z∈[-601,-340]mm · 3 of 88 slices shown (3 of 4)]
[im 1/88]
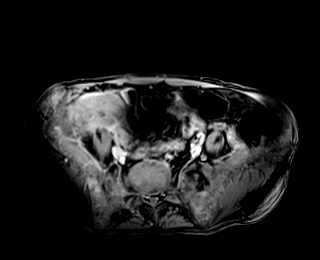
[im 44/88]
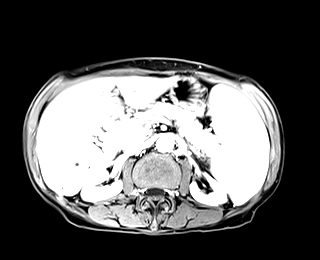
[im 88/88]
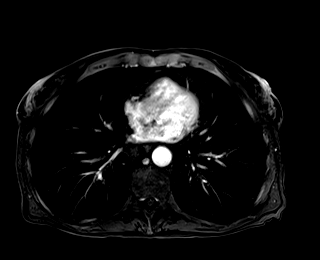

[Series 18: T1 dynamic fat-sat · axial · 3.0mm · 1.09mm/px · z∈[-601,-340]mm · 3 of 88 slices shown (4 of 5)]
[im 1/88]
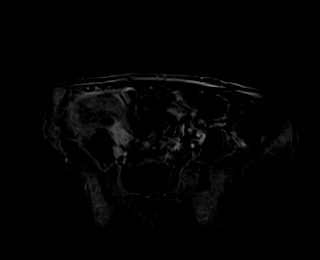
[im 44/88]
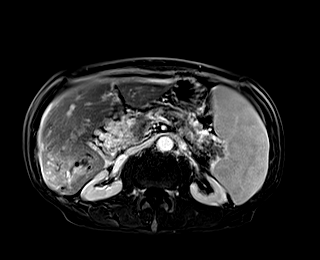
[im 88/88]
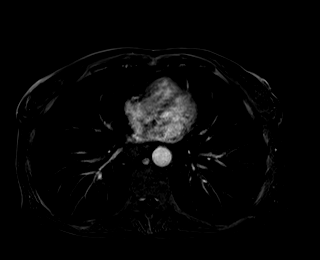

[Series 19: T1 dynamic post-contrast · coronal · 3.0mm · 1.31mm/px · 3 of 72 slices shown]
[im 1/72]
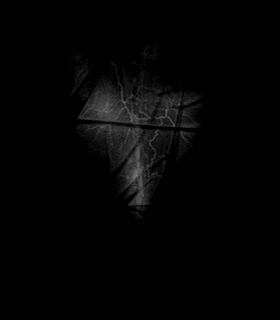
[im 36/72]
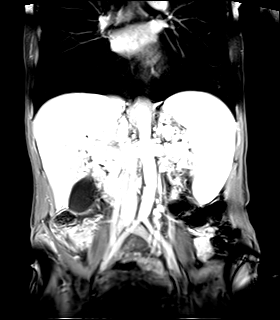
[im 72/72]
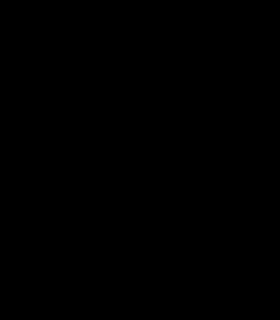

[Series 20: T1 dynamic fat-sat post-contrast · axial · 3.0mm · 1.09mm/px · z∈[-601,-340]mm · 3 of 88 slices shown (4 of 4)]
[im 1/88]
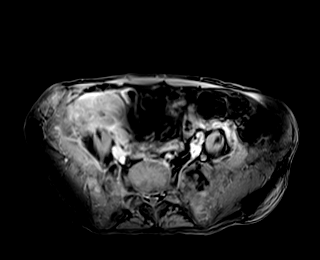
[im 44/88]
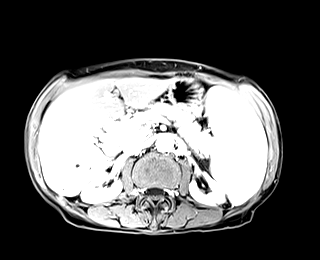
[im 88/88]
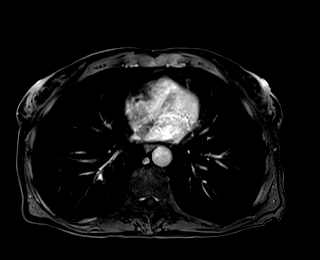

[Series 21: T1 dynamic fat-sat · axial · 3.0mm · 1.09mm/px · z∈[-601,-340]mm · 3 of 88 slices shown (5 of 5)]
[im 1/88]
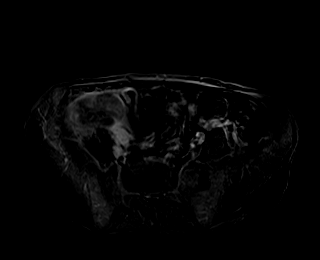
[im 44/88]
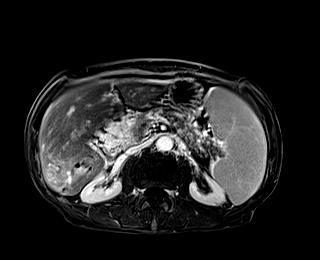
[im 88/88]
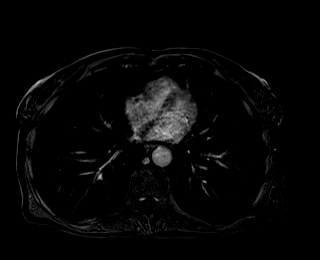

[48 of 48 positions shown; findings below may reference images not displayed]

FINDINGS: Lower chest: No acute findings.

Hepatobiliary: A heterogeneously enhancing mass is seen in the
posterior right hepatic lobe which measures 5.2 x 4.1 cm on image
33/13. This shows mild increase since prior MRI when it measured
x 3.6 cm.

No other hepatic masses are identified. Thrombus is seen throughout
the main, right and left portal veins, with cavernous
transformation. The portal vein thrombus appears to show contrast
enhancement on subtraction imaging, highly suspicious for tumor
thrombus.

Pancreas:  No mass or inflammatory changes.

Spleen:  Stable splenomegaly.  No splenic masses identified.

Adrenals/Urinary Tract: No masses identified. Tiny benign renal
cysts again noted. No evidence of hydronephrosis.

Stomach/Bowel: Incomplete visualization of a bulky right colonic
soft tissue mass, better visualized on recent CT, consistent with
known primary colon carcinoma.

Vascular/Lymphatic: No pathologically enlarged lymph nodes
identified. No acute vascular findings. Prominent portal venous
systemic collaterals, consistent with portal venous hypertension.

Other:  None.

Musculoskeletal: No suspicious bone lesions identified. Several
benign hemangiomas are again seen in the thoracolumbar spine.
IMPRESSION: Increased size of 5.2 cm mass in the posterior right hepatic lobe,
consistent with metastatic disease.

Diffuse portal vein thrombosis, highly suspicious for tumor
thrombus.

Stable cavernous transformation of portal vein, portosystemic venous
collaterals, and splenomegaly .

Incomplete visualization of bulky right colonic soft tissue mass,
consistent with known primary colon carcinoma.

## 2021-09-29 IMAGING — MR MR HEAD WO/W CM
15 series · 48 of 48 positions shown · IV contrast (6ml Gadavist)
Comparison: None.

CLINICAL DATA: History of colon and liver cancer. Right-sided
weakness from previous stroke.

EXAM:
MRI HEAD WITHOUT AND WITH CONTRAST
TECHNIQUE: Multiplanar, multiecho pulse sequences of the brain and surrounding
structures were obtained without and with intravenous contrast.
CONTRAST:  6mL GADAVIST GADOBUTROL 1 MMOL/ML IV SOLN

[Series 5: ax dwi_tracew · axial · 3.0mm · 0.65mm/px · z∈[-33,+116]mm · 4 of 48 slices shown]
[im 1/48]
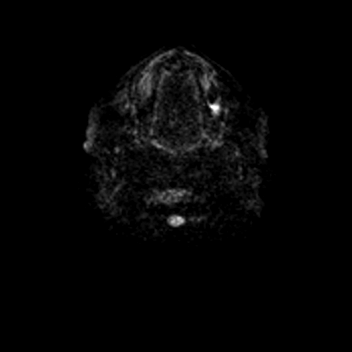
[im 16/48]
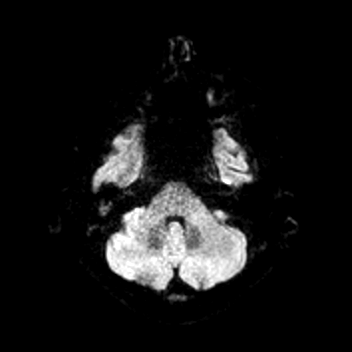
[im 32/48]
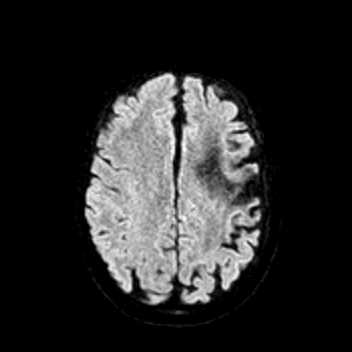
[im 48/48]
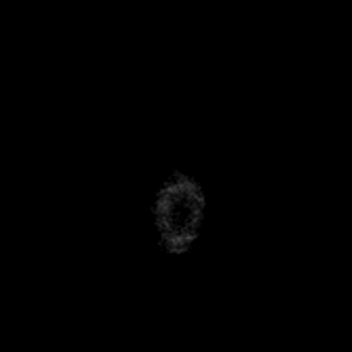

[Series 6: ax dwi_adc · axial · 3.0mm · 0.65mm/px · z∈[-33,+116]mm · 3 of 48 slices shown]
[im 1/48]
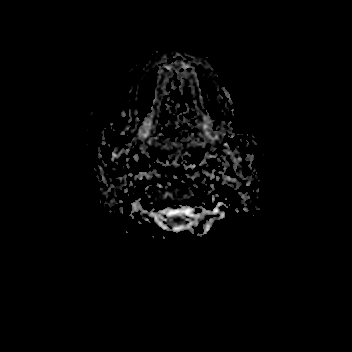
[im 24/48]
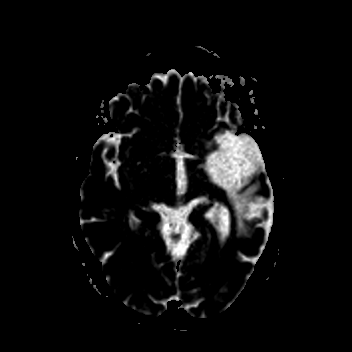
[im 48/48]
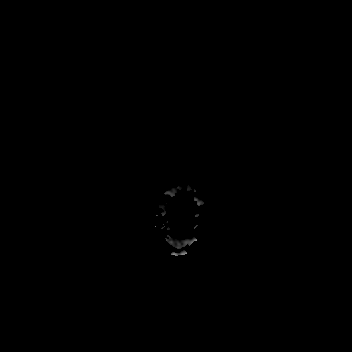

[Series 7: cor dwi_tracew · coronal · 5.0mm · 0.68mm/px · 2 of 40 slices shown]
[im 1/40]
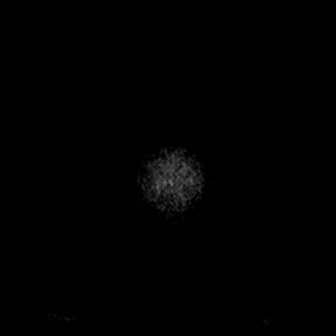
[im 40/40]
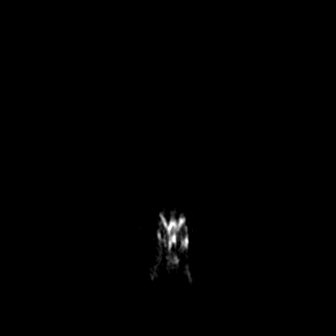

[Series 8: cor dwi_adc · coronal · 5.0mm · 0.68mm/px · 2 of 40 slices shown]
[im 1/40]
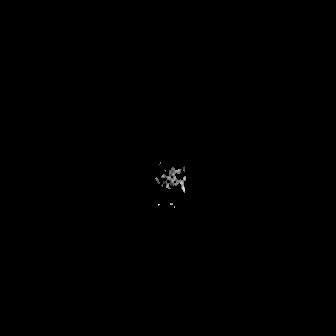
[im 40/40]
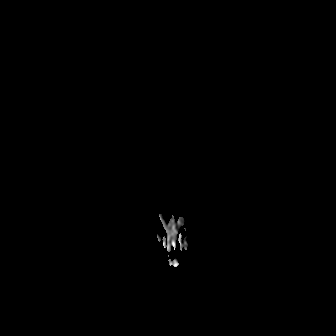

[Series 9: T1 · sagittal · 5.0mm · 0.62mm/px · 1 of 21 slices shown (1 of 2)]
[im 1/21]
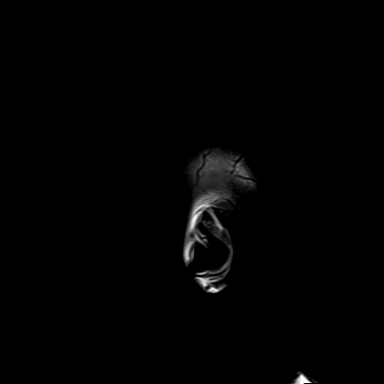

[Series 10: T2 · axial · 5.0mm · 0.53mm/px · 1 of 26 slices shown (1 of 2)]
[im 1/26]
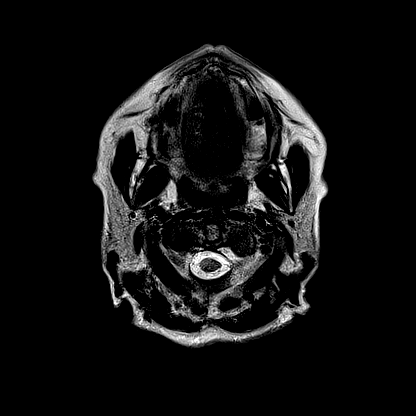

[Series 11: mag_images · axial · 3.0mm · 0.90mm/px · z∈[-42,+127]mm · 3 of 60 slices shown]
[im 1/60]
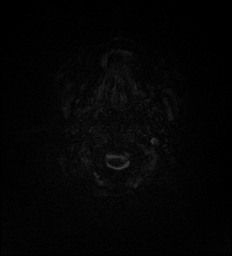
[im 30/60]
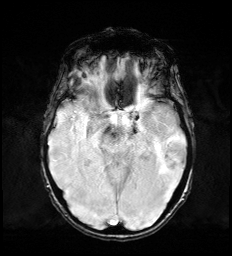
[im 60/60]
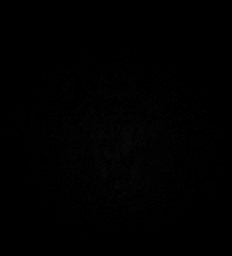

[Series 12: pha_images · axial · 3.0mm · 0.90mm/px · z∈[-42,+127]mm · 3 of 60 slices shown]
[im 1/60]
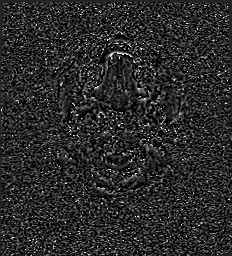
[im 30/60]
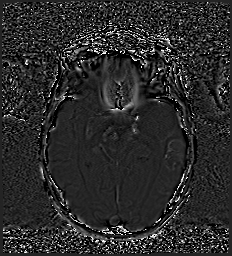
[im 60/60]
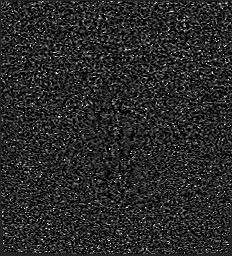

[Series 13: swi_images · axial · 3.0mm · 0.90mm/px · z∈[-42,+127]mm · 3 of 60 slices shown]
[im 1/60]
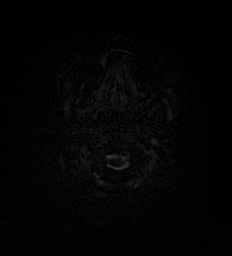
[im 30/60]
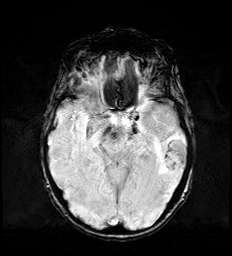
[im 60/60]
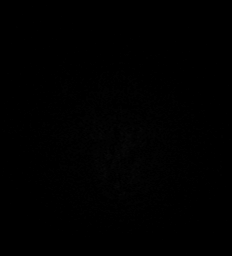

[Series 15: FLAIR · axial · 3.0mm · 0.53mm/px · z∈[-35,+120]mm · 3 of 55 slices shown]
[im 1/55]
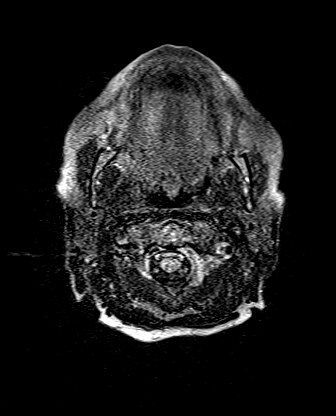
[im 28/55]
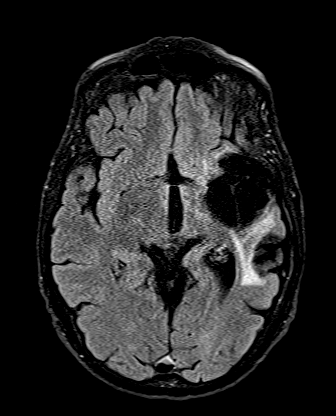
[im 55/55]
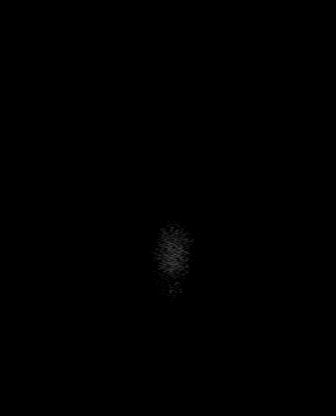

[Series 16: T1 · axial · 1.0mm · 0.98mm/px · z∈[-23,+129]mm · 9 of 160 slices shown (2 of 2)]
[im 1/160]
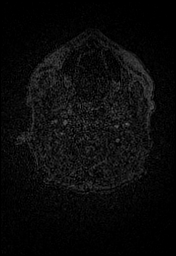
[im 20/160]
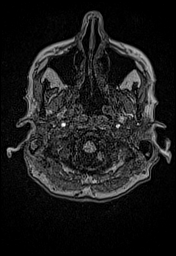
[im 40/160]
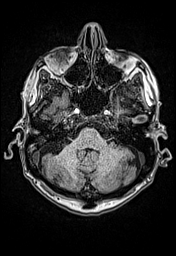
[im 60/160]
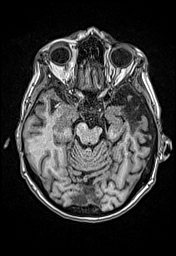
[im 80/160]
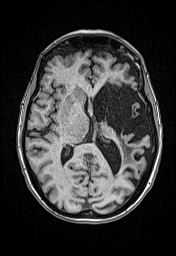
[im 100/160]
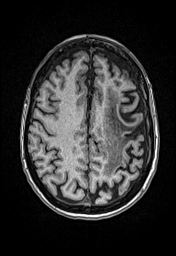
[im 120/160]
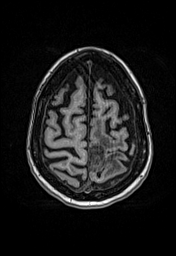
[im 140/160]
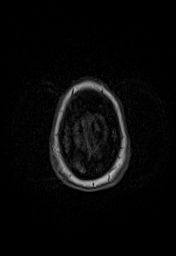
[im 160/160]
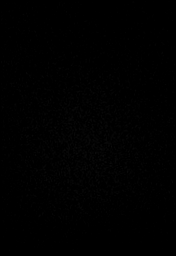

[Series 17: T2 · coronal · 5.0mm · 0.57mm/px · 2 of 29 slices shown (2 of 2)]
[im 1/29]
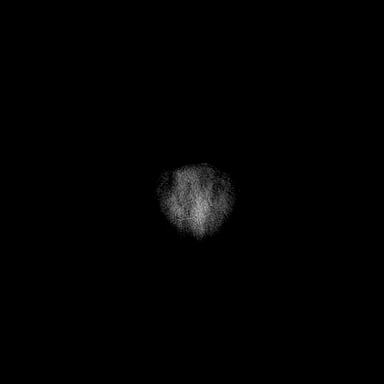
[im 29/29]
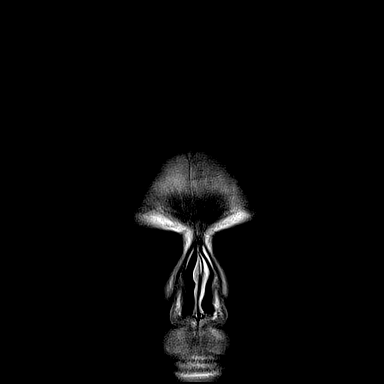

[Series 18: T1 post-contrast · axial · 1.0mm · 0.98mm/px · z∈[-23,+129]mm · 9 of 160 slices shown (1 of 3)]
[im 1/160]
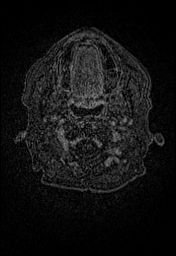
[im 20/160]
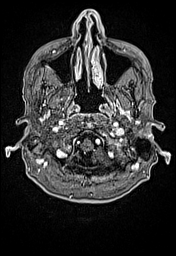
[im 40/160]
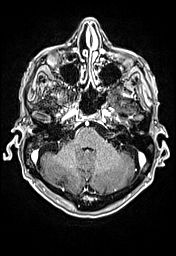
[im 60/160]
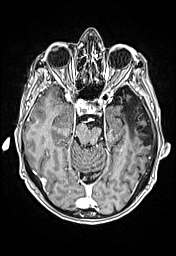
[im 80/160]
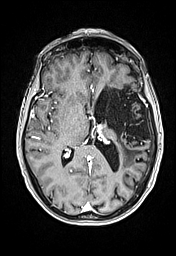
[im 100/160]
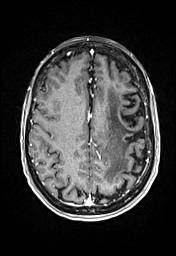
[im 120/160]
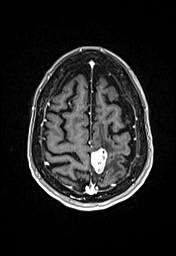
[im 140/160]
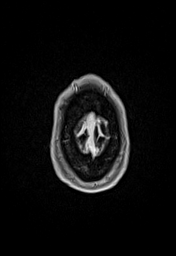
[im 160/160]
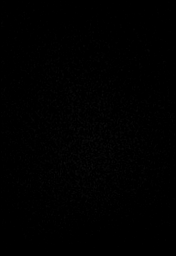

[Series 19: T1 post-contrast · coronal · 5.0mm · 0.57mm/px · 2 of 29 slices shown (2 of 3)]
[im 1/29]
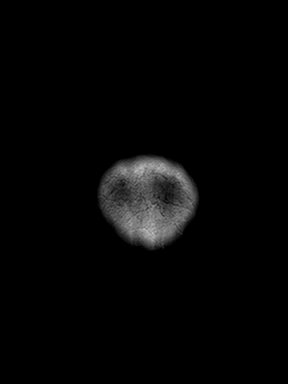
[im 29/29]
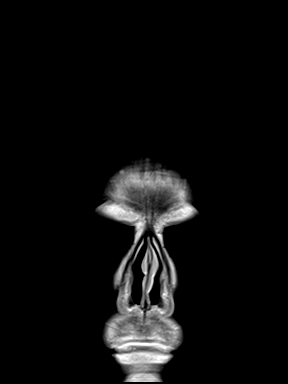

[Series 20: T1 post-contrast · sagittal · 5.0mm · 0.62mm/px · 1 of 21 slices shown (3 of 3)]
[im 1/21]
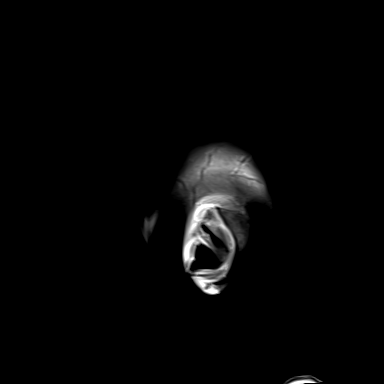

[48 of 48 positions shown; findings below may reference images not displayed]

FINDINGS: Brain: 2.4 cm heterogeneously enhancing upper left perirolandic
mass. No second mass is seen.

Large remote infarct affecting the inferior division left MCA
territory involving the anterior temporal lobe, insula, deep gray
nuclei, and frontal operculum. There is likely vasogenic edema in
addition to the gliosis. Wallerian degeneration at the left
brainstem

Vascular: Normal flow voids and vascular enhancements

Skull and upper cervical spine: Negative

Sinuses/Orbits: Negative

Other: These results will be called to the ordering clinician or
representative by the Radiologist Assistant, and communication
documented in the PACS or [REDACTED].
IMPRESSION: 1. 2.4 cm high left perirolandic mass consistent with a solitary
metastasis in this setting.
2. Large remote left MCA branch infarct.

## 2021-09-29 MED ORDER — GADOBUTROL 1 MMOL/ML IV SOLN
6.0000 mL | Freq: Once | INTRAVENOUS | Status: AC | PRN
  Administered 2021-09-29: 6 mL via INTRAVENOUS

## 2021-09-30 ENCOUNTER — Other Ambulatory Visit: Payer: Self-pay | Admitting: Oncology

## 2021-09-30 DIAGNOSIS — C182 Malignant neoplasm of ascending colon: Secondary | ICD-10-CM

## 2021-09-30 MED ORDER — LOPERAMIDE HCL 2 MG PO CAPS: 2.0000 mg | ORAL_CAPSULE | ORAL | 1 refills | Status: AC

## 2021-09-30 NOTE — Progress Notes (Signed)
DISCONTINUE ON PATHWAY REGIMEN - Colorectal     A cycle is every 14 days:     Bevacizumab-xxxx      Oxaliplatin      Leucovorin      Fluorouracil      Fluorouracil   **Always confirm dose/schedule in your pharmacy ordering system**  REASON: Disease Progression PRIOR TREATMENT: [id number]: mFOLFOX6 + Bevacizumab q14 Days TREATMENT RESPONSE: Progressive Disease (PD)  START ON PATHWAY REGIMEN - Colorectal     A cycle is every 14 days:     Irinotecan      Leucovorin      Fluorouracil      Fluorouracil   **Always confirm dose/schedule in your pharmacy ordering system**  Patient Characteristics: Distant Metastases, Nonsurgical Candidate, KRAS/NRAS Mutation Positive/Unknown (BRAF V600 Wild-Type/Unknown), Standard Cytotoxic Therapy, Second Line Standard Cytotoxic Therapy, Bevacizumab Ineligible Tumor Location: Colon Therapeutic Status: Distant Metastases Microsatellite/Mismatch Repair Status: MSS/pMMR BRAF Mutation Status: Awaiting Test Results KRAS/NRAS Mutation Status: Mutation Positive Standard Cytotoxic Line of Therapy: Second Line Standard Cytotoxic Therapy Bevacizumab Eligibility: Ineligible Intent of Therapy: Non-Curative / Palliative Intent, Discussed with Patient 

## 2021-10-01 ENCOUNTER — Inpatient Hospital Stay: Payer: Medicare Other

## 2021-10-01 ENCOUNTER — Inpatient Hospital Stay (HOSPITAL_BASED_OUTPATIENT_CLINIC_OR_DEPARTMENT_OTHER): Payer: Medicare Other | Admitting: Oncology

## 2021-10-01 ENCOUNTER — Other Ambulatory Visit: Payer: Self-pay

## 2021-10-01 ENCOUNTER — Encounter: Payer: Self-pay | Admitting: Oncology

## 2021-10-01 VITALS — BP 114/74 | HR 97 | Temp 97.0°F | Resp 18 | Wt 112.2 lb

## 2021-10-01 DIAGNOSIS — C7931 Secondary malignant neoplasm of brain: Secondary | ICD-10-CM

## 2021-10-01 DIAGNOSIS — R569 Unspecified convulsions: Secondary | ICD-10-CM

## 2021-10-01 DIAGNOSIS — K7030 Alcoholic cirrhosis of liver without ascites: Secondary | ICD-10-CM

## 2021-10-01 DIAGNOSIS — C182 Malignant neoplasm of ascending colon: Secondary | ICD-10-CM

## 2021-10-01 DIAGNOSIS — Z5111 Encounter for antineoplastic chemotherapy: Secondary | ICD-10-CM | POA: Diagnosis not present

## 2021-10-01 DIAGNOSIS — Z7189 Other specified counseling: Secondary | ICD-10-CM

## 2021-10-01 LAB — CBC WITH DIFFERENTIAL/PLATELET
Abs Immature Granulocytes: 0.03 10*3/uL (ref 0.00–0.07)
Basophils Absolute: 0.1 10*3/uL (ref 0.0–0.1)
Basophils Relative: 1 %
Eosinophils Absolute: 0.3 10*3/uL (ref 0.0–0.5)
Eosinophils Relative: 4 %
HCT: 35.5 % — ABNORMAL LOW (ref 36.0–46.0)
Hemoglobin: 11.2 g/dL — ABNORMAL LOW (ref 12.0–15.0)
Immature Granulocytes: 0 %
Lymphocytes Relative: 9 %
Lymphs Abs: 0.8 10*3/uL (ref 0.7–4.0)
MCH: 27.1 pg (ref 26.0–34.0)
MCHC: 31.5 g/dL (ref 30.0–36.0)
MCV: 86 fL (ref 80.0–100.0)
Monocytes Absolute: 0.9 10*3/uL (ref 0.1–1.0)
Monocytes Relative: 9 %
Neutro Abs: 7.1 10*3/uL (ref 1.7–7.7)
Neutrophils Relative %: 77 %
Platelets: 266 10*3/uL (ref 150–400)
RBC: 4.13 MIL/uL (ref 3.87–5.11)
RDW: 21.3 % — ABNORMAL HIGH (ref 11.5–15.5)
WBC: 9.3 10*3/uL (ref 4.0–10.5)
nRBC: 0 % (ref 0.0–0.2)

## 2021-10-01 LAB — COMPREHENSIVE METABOLIC PANEL
ALT: 12 U/L (ref 0–44)
AST: 23 U/L (ref 15–41)
Albumin: 3.6 g/dL (ref 3.5–5.0)
Alkaline Phosphatase: 130 U/L — ABNORMAL HIGH (ref 38–126)
Anion gap: 9 (ref 5–15)
BUN: 18 mg/dL (ref 8–23)
CO2: 23 mmol/L (ref 22–32)
Calcium: 8.6 mg/dL — ABNORMAL LOW (ref 8.9–10.3)
Chloride: 105 mmol/L (ref 98–111)
Creatinine, Ser: 1.16 mg/dL — ABNORMAL HIGH (ref 0.44–1.00)
GFR, Estimated: 54 mL/min — ABNORMAL LOW (ref >60)
Glucose, Bld: 106 mg/dL — ABNORMAL HIGH (ref 70–99)
Potassium: 3.7 mmol/L (ref 3.5–5.1)
Sodium: 137 mmol/L (ref 135–145)
Total Bilirubin: 0.5 mg/dL (ref 0.3–1.2)
Total Protein: 6.6 g/dL (ref 6.5–8.1)

## 2021-10-01 MED ORDER — HEPARIN SOD (PORK) LOCK FLUSH 100 UNIT/ML IV SOLN
500.0000 [IU] | Freq: Once | INTRAVENOUS | Status: DC
  Filled 2021-10-01: qty 5

## 2021-10-01 NOTE — Progress Notes (Signed)
Hematology/Oncology note [phone] [phone]   Patient Care Team: [facility] as PCP - General  REFERRING PROVIDER: [facility]  CHIEF COMPLAINTS/REASON FOR VISIT:  Follow up for stage IV colon cancer  HISTORY OF PRESENTING ILLNESS:   Cheryl Baldwin is a  60 y.o.  female with PMH listed below was seen in consultation at the request of  [place], [facility]  for evaluation of stage IV colon cancer.   June 2022.  Patient presented to primary care provider for evaluation of unintentional weight loss over the past 3 months.  04/30/2021, CT chest was obtained. CT shortness of breath ill-defined irregular hypoattenuated and hypoattenuated adrenal masses on the background of heterogeneous attenuation of the liver parenchyma. 5.5 cm soft tissue attenuation within the liver hilum.  Partially visualized linear metallic density within the soft tissue density at the hilum of the liver.  Splenomegaly, aortic atherosclerosis. Soft tissue/bronchoscopy nodules within the right lung.  Patient establish care with [facility] 05/23/2021. 06/06/2021 patient underwent MRI abdomen with and without contrast. Redemonstrated large peripheral arterial phase hyperenhancing mass in the right hepatic lobe.  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 Partially imaged large cecal mass seen.  06/24/2021, patient underwent ultrasound liver biopsy Pathology showed metastatic adenocarcinoma, consistent with spread from a colon primary.  Patient prefers to have oncology care closer to her home and was referred to establish care. Patient is a current everyday smoker.  She has a history of alcohol use.  Patient denies any regular alcohol use currently.  She drinks socially.  Denies nausea, vomiting, blood in the stool.  She lives with her sister [name] who  accompanied patient to today's visit.  Appetite is poor.  She has some right sided abdominal pressure discomfort.  History of stroke 30 years ago with residual right-sided weakness.  She takes Xarelto 10 mg daily.  She is able to ambulate independently.   History of MI several years ago.  Patient has a son who lives out of state.  Per patient and her sister, son is not much involved in patient's current life.  # History of MI [ age of 12] and Stroke [ age of 72], on Xarelto $RemoveBe'10mg'rJZAgjpUg$  daily. Last Echo per PCP note was in 2011, 50% LVEF, result is not available in current EMR. 07/22/2021 2D echo showed LVEF 55%.   07/09/2021- 08/06/2021 5-FU treatment x 2 cycles with Dr.Rao  08/20/2021, patient switched her care back to me and received third cycle of 5-FU. 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     INTERVAL HISTORY Cheryl Baldwin is a 61 y.o. female who has above history reviewed by me today presents for follow up visit for management of stage IV colon cancer.  Patient was accompanied with her sister and her son. 09/25/2021, CT chest abdomen pelvis showed large right colonic mass involving the cecum, ileocecal valve and ascending colon distal to the ileocecal valve.  There is a fingerlike projection of soft tissue into the media.  Cecal fat suggesting transmural tumor spread.  Ill-defined soft tissue in the porta hepatis likely related to necrotic metastatic lymphadenopathy.  Heterogeneous ill-defined mass in the posterior right liver consistent with metastasis.  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  Scattered tiny pulmonary nodules.  Trace free fluid in the pelvis. 09/29/2021, MRI abdomen  with and without contrast showed increased size of 5.2 cm mass in the posterior right hepatic lobe.  Consistent with metastatic disease.  Diffuse portal vein thrombosis, highly suspicious for  tumor thrombus.  Stable cavernous transformation of portal vein, portosystemic venous collaterals, and splenomegaly   09/29/2021, MRI brain with and without contrast showed 2.4 cm high left perirolandic mass consistent with a solitary metastasis in this setting. Large remote left MCA branch infarct.   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   Review of Systems  Constitutional:  Positive for fatigue. Negative for appetite change, chills and fever.  HENT:   Negative for hearing loss and voice change.   Eyes:  Negative for eye problems.  Respiratory:  Negative for chest tightness and cough.   Cardiovascular:  Negative for chest pain.  Gastrointestinal:  Negative for abdominal distention and blood in stool.  Endocrine: Negative for hot flashes.  Genitourinary:  Negative for difficulty urinating and frequency.   Musculoskeletal:  Negative for arthralgias.  Skin:  Negative for itching and rash.  Neurological:  Positive for numbness. Negative for extremity weakness.       Chronic right side weakness due to previous stroke   Hematological:  Negative for adenopathy.  Psychiatric/Behavioral:  Negative for confusion.    MEDICAL HISTORY:  Past Medical History:  Diagnosis Date   Heart attack ([place])    Heart disease    heart attack - no stents   High cholesterol    Hx of blood clots    legs   Hypertension    Kidney disease    Stroke [facility])     SURGICAL HISTORY: Past Surgical History:  Procedure Laterality Date   APPENDECTOMY     LIVER BIOPSY     PORTA CATH INSERTION N/A 07/14/2021   Procedure: PORTA CATH INSERTION;  Surgeon: Algernon Huxley, MD;  Location: [place] [facility];  Service: Cardiovascular;  Laterality: N/A;    SOCIAL HISTORY: Social History   Socioeconomic History   Marital status: Divorced    Spouse name: Not on file   Number of children: Not on file   Years of education: Not on file   Highest education level: Not on file  Occupational  History   Not on file  Tobacco Use   Smoking status: Every Day    Packs/day: 1.00    Years: 40.00    Pack years: 40.00    Types: Cigarettes   Smokeless tobacco: Never  Vaping Use   Vaping Use: Never used  Substance and Sexual Activity   Alcohol use: Not Currently    Comment: rarely   Drug use: No   Sexual activity: Not Currently  Other Topics Concern   Not on file  Social History Narrative   Not on file   Social Determinants of Health   Financial Resource Strain: Not on file  Food Insecurity: Not on file  Transportation Needs: Not on file  Physical Activity: Not on file  Stress: Not on file  Social Connections: Not on file  Intimate Partner Violence: Not on file    FAMILY HISTORY: Family History  Problem Relation Age of Onset   Other Mother        "old age"   Alzheimer's disease Mother    Heart attack Father    Breast cancer Paternal Aunt     ALLERGIES:  is allergic to penicillins.  MEDICATIONS:  Current Outpatient Medications  Medication Sig Dispense Refill   acetaminophen (TYLENOL) 500  MG tablet Take 500 mg by mouth every 6 (six) hours as needed.     albuterol (VENTOLIN HFA) 108 (90 Base) MCG/ACT inhaler Inhale 1-2 puffs into the lungs every 6 (six) hours as needed for wheezing or shortness of breath. 1 each 0   diazepam (VALIUM) 5 MG tablet Take by mouth.     fluticasone (FLONASE) 50 MCG/ACT nasal spray Place 2 sprays into both nostrils daily. 15.8 mL 0   gabapentin (NEURONTIN) 100 MG capsule Take 2 capsules (200 mg total) by mouth as directed. Take 2 capsules at night,  1 capsule during daytime 90 capsule 1   Iron-Vitamin C 65-125 MG TABS Take 1 tablet by mouth daily. 30 tablet 2   levETIRAcetam (KEPPRA) 500 MG tablet Take 1 tablet (500 mg total) by mouth 2 (two) times daily. 60 tablet 3   loperamide (IMODIUM) 2 MG capsule Take 1 capsule (2 mg total) by mouth See admin instructions. Initial: 4 mg, followed by 2 mg after each loose stool; maximum: 16 mg/day 60  capsule 1   rivaroxaban (XARELTO) 10 MG TABS tablet Take 1 tablet by mouth daily with breakfast.     rosuvastatin (CRESTOR) 20 MG tablet Take 20 mg by mouth at bedtime.     traMADol (ULTRAM) 50 MG tablet Take 1 tablet (50 mg total) by mouth every 8 (eight) hours as needed for moderate pain or severe pain. 15 tablet 0   cyclobenzaprine (FLEXERIL) 10 MG tablet Take 1 tablet (10 mg total) by mouth 3 (three) times daily as needed for muscle spasms. (Patient not taking: No sig reported) 30 tablet 0   No current facility-administered medications for this visit.   Facility-Administered Medications Ordered in Other Visits  Medication Dose Route Frequency Provider Last Rate Last Admin   0.9 %  sodium chloride infusion   Intravenous Continuous Creig Hines, MD   Stopped at 07/23/21 1107   0.9 %  sodium chloride infusion   Intravenous Continuous Creig Hines, MD   Stopped at 08/06/21 1115     PHYSICAL EXAMINATION: ECOG PERFORMANCE STATUS: 1 - Symptomatic but completely ambulatory Vitals:   10/01/21 0930  BP: 114/74  Pulse: 97  Resp: 18  Temp: (!) 97 F (36.1 C)  SpO2: 100%   Filed Weights   10/01/21 0930  Weight: 112 lb 3.2 oz (50.9 kg)    Physical Exam Constitutional:      General: She is not in acute distress.    Comments: Thin, ambulates independantly  HENT:     Head: Normocephalic and atraumatic.  Eyes:     General: No scleral icterus. Cardiovascular:     Rate and Rhythm: Normal rate and regular rhythm.     Heart sounds: Normal heart sounds.  Pulmonary:     Effort: Pulmonary effort is normal. No respiratory distress.     Breath sounds: No wheezing.  Abdominal:     General: Bowel sounds are normal. There is no distension.     Palpations: Abdomen is soft.  Musculoskeletal:        General: No deformity. Normal range of motion.     Cervical back: Normal range of motion and neck supple.  Skin:    General: Skin is warm and dry.     Findings: No erythema or rash.   Neurological:     Mental Status: She is alert and oriented to person, place, and time. Mental status is at baseline.     Cranial Nerves: No cranial nerve deficit.  Coordination: Coordination normal.     Comments: Decreased right dorsiflextion  Psychiatric:        Mood and Affect: Mood normal.    LABORATORY DATA:  I have reviewed the data as listed Lab Results  Component Value Date   WBC 9.3 10/01/2021   HGB 11.2 (L) 10/01/2021   HCT 35.5 (L) 10/01/2021   MCV 86.0 10/01/2021   PLT 266 10/01/2021   Recent Labs    09/03/21 0823 09/17/21 0838 10/01/21 0854  NA 137 138 137  K 3.5 3.8 3.7  CL 104 104 105  CO2 $Re'25 26 23  'lQc$ GLUCOSE 125* 98 106*  BUN $Re'19 18 18  'rkh$ CREATININE 1.07* 1.22* 1.16*  CALCIUM 8.9 8.7* 8.6*  GFRNONAA 59* 50* 54*  PROT 6.6 6.3* 6.6  ALBUMIN 3.4* 3.5 3.6  AST $Re'23 21 23  'IKR$ ALT $R'12 14 12  'SH$ ALKPHOS 197* 154* 130*  BILITOT 0.5 0.7 0.5    Iron/TIBC/Ferritin/ %Sat    Component Value Date/Time   IRON 19 (L) 08/06/2021 0837   TIBC 302 08/06/2021 0837   FERRITIN 49 08/06/2021 0837   IRONPCTSAT 6 (L) 08/06/2021 0837      RADIOGRAPHIC STUDIES: I have personally reviewed the radiological images as listed and agreed with the findings in the report. MR BRAIN W WO CONTRAST  Result Date: 09/29/2021 CLINICAL DATA:  History of colon and liver cancer. Right-sided weakness from previous stroke. EXAM: MRI HEAD WITHOUT AND WITH CONTRAST TECHNIQUE: Multiplanar, multiecho pulse sequences of the brain and surrounding structures were obtained without and with intravenous contrast. CONTRAST:  61mL GADAVIST GADOBUTROL 1 MMOL/ML IV SOLN COMPARISON:  None. FINDINGS: Brain: 2.4 cm heterogeneously enhancing upper left perirolandic mass. No second mass is seen. Large remote infarct affecting the inferior division left MCA territory involving the anterior temporal lobe, insula, deep gray nuclei, and frontal operculum. There is likely vasogenic edema in addition to the gliosis. Wallerian  degeneration at the left brainstem Vascular: Normal flow voids and vascular enhancements Skull and upper cervical spine: Negative Sinuses/Orbits: Negative Other: These results will be called to the ordering clinician or representative by the Radiologist Assistant, and communication documented in the PACS or [employer]. IMPRESSION: 1. 2.4 cm high left perirolandic mass consistent with a solitary metastasis in this setting. 2. Large remote left MCA branch infarct. Electronically Signed   By: Jorje Guild M.D.   On: 09/29/2021 11:10   MR Abdomen W Wo Contrast  Result Date: 09/29/2021 CLINICAL DATA:  Colon carcinoma. EXAM: MRI ABDOMEN WITHOUT AND WITH CONTRAST TECHNIQUE: Multiplanar multisequence MR imaging of the abdomen was performed both before and after the administration of intravenous contrast. CONTRAST:  9mL GADAVIST GADOBUTROL 1 MMOL/ML IV SOLN COMPARISON:  CT on 09/25/2021, and MRI from due on 07/07/2021 FINDINGS: Lower chest: No acute findings. Hepatobiliary: A heterogeneously enhancing mass is seen in the posterior right hepatic lobe which measures 5.2 x 4.1 cm on image 33/13. This shows mild increase since prior MRI when it measured 4.3 x 3.6 cm. No other hepatic masses are identified. Thrombus is seen throughout the main, right and left portal veins, with cavernous transformation. The portal vein thrombus appears to show contrast enhancement on subtraction imaging, highly suspicious for tumor thrombus. Pancreas:  No mass or inflammatory changes. Spleen:  Stable splenomegaly.  No splenic masses identified. Adrenals/Urinary Tract: No masses identified. Tiny benign renal cysts again noted. No evidence of hydronephrosis. Stomach/Bowel: Incomplete visualization of a bulky right colonic soft tissue mass, better visualized on recent CT, consistent  with known primary colon carcinoma. Vascular/Lymphatic: No pathologically enlarged lymph nodes identified. No acute vascular findings. Prominent portal  venous systemic collaterals, consistent with portal venous hypertension. Other:  None. Musculoskeletal: No suspicious bone lesions identified. Several benign hemangiomas are again seen in the thoracolumbar spine. IMPRESSION: Increased size of 5.2 cm mass in the posterior right hepatic lobe, consistent with metastatic disease. Diffuse portal vein thrombosis, highly suspicious for tumor thrombus. Stable cavernous transformation of portal vein, portosystemic venous collaterals, and splenomegaly . Incomplete visualization of bulky right colonic soft tissue mass, consistent with known primary colon carcinoma. Electronically Signed   By: Marlaine Hind M.D.   On: 09/29/2021 12:13   CT CHEST ABDOMEN PELVIS W CONTRAST  Result Date: 09/25/2021 CLINICAL DATA:  Colon cancer.  Restaging. EXAM: CT CHEST, ABDOMEN, AND PELVIS WITH CONTRAST TECHNIQUE: Multidetector CT imaging of the chest, abdomen and pelvis was performed following the standard protocol during bolus administration of intravenous contrast. CONTRAST:  42mL OMNIPAQUE IOHEXOL 300 MG/ML  SOLN COMPARISON:  None. FINDINGS: CT CHEST FINDINGS Cardiovascular: The heart size is normal. No substantial pericardial effusion. Coronary artery calcification is evident. Mild atherosclerotic calcification is noted in the wall of the thoracic aorta. Right Port-A-Cath tip is positioned in the mid right atrium. Mediastinum/Nodes: No mediastinal lymphadenopathy. There is no hilar lymphadenopathy. The esophagus has normal imaging features. Paraesophageal varices evident. There is no axillary lymphadenopathy. Lungs/Pleura: 4 mm right upper lobe pulmonary nodule identified on image 22/4, measured at 3 mm previously. 4 mm right lower lobe nodule on 83/4 is stable. 2 mm left upper lobe nodule on 20/4 is unchanged. No new suspicious pulmonary nodule or mass. No focal airspace consolidation. No pleural effusion. Musculoskeletal: No worrisome lytic or sclerotic osseous abnormality. Mild  compression deformity noted at T5 and T7. CT ABDOMEN PELVIS FINDINGS Hepatobiliary: Subtle nodularity of liver contour evident. 5.4 x 4.8 cm irregular heterogeneously enhancing mass again identified posterior right liver. Outside MRI from [facility] dated 06/06/2021 is available. Measuring this lesion at a similar level and in the same dimensions, the mass was 4.5 x 4.2 cm on the previous study. Ill-defined margins in irregular shape make reproducible measurement difficult. Mild intrahepatic biliary duct dilatation again noted. Gallbladder is nondistended. Pancreas: No focal mass lesion. No dilatation of the main duct. No intraparenchymal cyst. No peripancreatic edema. Spleen: Spleen is enlarged at 14.5 cm craniocaudal length. Adrenals/Urinary Tract: No adrenal nodule or mass. Kidneys unremarkable No evidence for hydroureter. The urinary bladder appears normal for the degree of distention. Stomach/Bowel: Stomach is moderately distended with food and contrast material. Duodenum is normally positioned as is the ligament of Treitz. No small bowel wall thickening. No small bowel dilatation. Marked wall thickening is seen in the cecum, involving the ileocecal valve and extending distal to the level of the ileocecal valve. Colonic wall is ill-defined in there is a finger-like projection of soft tissue extending into the pericolonic fat (image 86/2) suggesting direct transmural tumor extension. No evidence for colonic obstruction. Vascular/Lymphatic: There is moderate atherosclerotic calcification of the abdominal aorta without aneurysm. IVC filter visualized in situ ill-defined low-density soft tissue in the hepatoduodenal ligament/porta hepatis is probably necrotic lymphadenopathy. Chronic portal vein occlusion evident with recanalization of the paraumbilical vein and cavernous transformation in the porta hepatis. There is extensive venous collateralization in the upper abdomen. No retroperitoneal lymphadenopathy. No pelvic  sidewall lymphadenopathy. Reproductive: The uterus is unremarkable.  There is no adnexal mass. Other: Trace free fluid noted in the cul-de-sac. Musculoskeletal: No worrisome lytic or sclerotic  osseous abnormality. Sclerotic lesion in the sacrum likely a bone island. IMPRESSION: 1. Large right colonic mass involving the cecum, ileocecal valve, and ascending colon distal to the ileocecal valve. There is a finger-like projection of soft tissue into the medial Peri cecal fat suggesting transmural tumor spread. 2. Ill-defined soft tissue in the porta hepatis likely related to necrotic metastatic lymphadenopathy. 3. Heterogeneous ill-defined mass in the posterior right liver consistent with metastatic disease. This measures slightly larger than on outside MRI of 07/07/2021. 4. 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 5. Scattered tiny pulmonary nodules. Close attention on follow-up recommended to exclude metastatic disease. 6. Trace free fluid in the pelvis. Electronically Signed   By: Misty Stanley M.D.   On: 09/25/2021 15:18      ASSESSMENT & PLAN:  1. Malignant neoplasm of ascending colon ([place])   2. Focal seizures ([place])   3. Alcoholic cirrhosis of liver without ascites ([place])   4. Goals of care, counseling/discussion   5. Brain metastases ([place])    #Stage IV right-sided colon cancer with liver metastasis.   Foundation one testing showed NRAS wt, [id number], TMB 11 [high], MS stable, potential candidate for immunotherapy in future CEA trended up. CT chest abdomen pelvis with contrast and MRI abdominal pelvis with and without contrast, brain MRI images were reviewed and discussed with patient. Patient has disease progression Enlargement of the liver mass, ill-defined soft tissue in the porta hepatis, diffuse portal vein rhombus.  She has also had brain  metastasis.  I had a lengthy discussion with patient, sister and patient's son.  Prognosis is poor overall. I recommend patient establish care with radiation oncology.  Patient declined.   We discussed about using FOLFIRI as second line treatments.  I will not use bevacizumab due to newly discovered brain metastasis. Oxaliplatin was held previously given her initial symptoms of right lower extremity sensation which was initially thought to be neuropathy from previous stroke, now felt to be secondary to the focal seizure activity due to brain mets.  Therefore oxaliplatin could be used in the future as well. Potential candidate of panitumumab or cetuximab.  Tumor mutational burden of 11 mut/mb, MSI stable.  There might be a potential role of immunotherapy given that the mutation burden is above 10. Rationale and potential side effects of Irinotecan, and the potential other options were discussed with patient. Patient is reluctant.  She values her life quality highly.  She would like to further discuss with her family members before deciding.   #Right lower extremity focal seizure, follow-up with neurology. Keppra  #Liver cirrhosis, likely due to previous alcohol use.  Patient has had negative hepatitis panel in 2018 at  [facility]. Hep B core antibody, surface antibody, surface antigen were negative  Child Pugh A - 6 point.  I will defer management to GI  #Chemotherapy-induced anemia, hemoglobin stable.  Monitor. # IDA, iron panel showed decreased iron saturation.  Continue oral iron supplementation.   # she denies influenza Supportive care measures are necessary for patient well-being and will be provided as necessary. We spent sufficient time to discuss many aspect of care, questions were answered to patient's satisfaction.   Follow-up TBD Recommend patient to see palliative care service [facility] and I have another discussion about goals of care.  We will arrange additional chemotherapy  appointment if patient desires additional treatments.   All questions were answered. The patient  knows to call the clinic with any problems questions or concerns.  cc [place], [facility]    Earlie Server, MD, PhD 10/01/2021

## 2021-10-01 NOTE — Progress Notes (Signed)
Pt here for follow up. No new concerns voiced.   

## 2021-10-03 ENCOUNTER — Inpatient Hospital Stay: Payer: Medicare Other

## 2021-10-03 ENCOUNTER — Inpatient Hospital Stay (HOSPITAL_BASED_OUTPATIENT_CLINIC_OR_DEPARTMENT_OTHER): Payer: Medicare Other | Admitting: Internal Medicine

## 2021-10-03 DIAGNOSIS — R569 Unspecified convulsions: Secondary | ICD-10-CM

## 2021-10-03 DIAGNOSIS — C7931 Secondary malignant neoplasm of brain: Secondary | ICD-10-CM | POA: Insufficient documentation

## 2021-10-03 NOTE — Progress Notes (Signed)
I connected with Cheryl Baldwin on 10/03/21 at  9:30 AM EST by telephone visit and verified that I am speaking with the correct person using two identifiers.  I discussed the limitations, risks, security and privacy concerns of performing an evaluation and management service by telemedicine and the availability of in-person appointments. I also discussed with the patient that there may be a patient responsible charge related to this service. The patient expressed understanding and agreed to proceed.  Other persons participating in the visit and their role in the encounter:  sister  Patient's location:  Home  Provider's location:  Office  Chief Complaint:  Focal seizures ([place])  Brain metastases ([place])  History of Present Ilness: Cheryl Baldwin describes some improvement in leg sensory episodes following initiation of Keppra.  Recently completed brain MRI study. Continues to have issues with speech and right sided weakness unchanged from prior.  Recently met with Dr. Tasia Catchings who described progressive changes with colon cancer.    Observations: Language and cognition at baseline with dysphasia  Imaging:  MR BRAIN W WO CONTRAST  Result Date: 09/29/2021 CLINICAL DATA:  History of colon and liver cancer. Right-sided weakness from previous stroke. EXAM: MRI HEAD WITHOUT AND WITH CONTRAST TECHNIQUE: Multiplanar, multiecho pulse sequences of the brain and surrounding structures were obtained without and with intravenous contrast. CONTRAST:  36mL GADAVIST GADOBUTROL 1 MMOL/ML IV SOLN COMPARISON:  None. FINDINGS: Brain: 2.4 cm heterogeneously enhancing upper left perirolandic mass. No second mass is seen. Large remote infarct affecting the inferior division left MCA territory involving the anterior temporal lobe, insula, deep gray nuclei, and frontal operculum. There is likely vasogenic edema in addition to the gliosis. Wallerian degeneration at the left brainstem Vascular: Normal flow voids and vascular enhancements  Skull and upper cervical spine: Negative Sinuses/Orbits: Negative Other: These results will be called to the ordering clinician or representative by the Radiologist Assistant, and communication documented in the PACS or [employer]. IMPRESSION: 1. 2.4 cm high left perirolandic mass consistent with a solitary metastasis in this setting. 2. Large remote left MCA branch infarct. Electronically Signed   By: Jorje Guild M.D.   On: 09/29/2021 11:10   MR Abdomen W Wo Contrast  Result Date: 09/29/2021 CLINICAL DATA:  Colon carcinoma. EXAM: MRI ABDOMEN WITHOUT AND WITH CONTRAST TECHNIQUE: Multiplanar multisequence MR imaging of the abdomen was performed both before and after the administration of intravenous contrast. CONTRAST:  9mL GADAVIST GADOBUTROL 1 MMOL/ML IV SOLN COMPARISON:  CT on 09/25/2021, and MRI from due on 07/07/2021 FINDINGS: Lower chest: No acute findings. Hepatobiliary: A heterogeneously enhancing mass is seen in the posterior right hepatic lobe which measures 5.2 x 4.1 cm on image 33/13. This shows mild increase since prior MRI when it measured 4.3 x 3.6 cm. No other hepatic masses are identified. Thrombus is seen throughout the main, right and left portal veins, with cavernous transformation. The portal vein thrombus appears to show contrast enhancement on subtraction imaging, highly suspicious for tumor thrombus. Pancreas:  No mass or inflammatory changes. Spleen:  Stable splenomegaly.  No splenic masses identified. Adrenals/Urinary Tract: No masses identified. Tiny benign renal cysts again noted. No evidence of hydronephrosis. Stomach/Bowel: Incomplete visualization of a bulky right colonic soft tissue mass, better visualized on recent CT, consistent with known primary colon carcinoma. Vascular/Lymphatic: No pathologically enlarged lymph nodes identified. No acute vascular findings. Prominent portal venous systemic collaterals, consistent with portal venous hypertension. Other:  None.  Musculoskeletal: No suspicious bone lesions identified. Several benign hemangiomas are again  seen in the thoracolumbar spine. IMPRESSION: Increased size of 5.2 cm mass in the posterior right hepatic lobe, consistent with metastatic disease. Diffuse portal vein thrombosis, highly suspicious for tumor thrombus. Stable cavernous transformation of portal vein, portosystemic venous collaterals, and splenomegaly . Incomplete visualization of bulky right colonic soft tissue mass, consistent with known primary colon carcinoma. Electronically Signed   By: Marlaine Hind M.D.   On: 09/29/2021 12:13   CT CHEST ABDOMEN PELVIS W CONTRAST  Result Date: 09/25/2021 CLINICAL DATA:  Colon cancer.  Restaging. EXAM: CT CHEST, ABDOMEN, AND PELVIS WITH CONTRAST TECHNIQUE: Multidetector CT imaging of the chest, abdomen and pelvis was performed following the standard protocol during bolus administration of intravenous contrast. CONTRAST:  65mL OMNIPAQUE IOHEXOL 300 MG/ML  SOLN COMPARISON:  None. FINDINGS: CT CHEST FINDINGS Cardiovascular: The heart size is normal. No substantial pericardial effusion. Coronary artery calcification is evident. Mild atherosclerotic calcification is noted in the wall of the thoracic aorta. Right Port-A-Cath tip is positioned in the mid right atrium. Mediastinum/Nodes: No mediastinal lymphadenopathy. There is no hilar lymphadenopathy. The esophagus has normal imaging features. Paraesophageal varices evident. There is no axillary lymphadenopathy. Lungs/Pleura: 4 mm right upper lobe pulmonary nodule identified on image 22/4, measured at 3 mm previously. 4 mm right lower lobe nodule on 83/4 is stable. 2 mm left upper lobe nodule on 20/4 is unchanged. No new suspicious pulmonary nodule or mass. No focal airspace consolidation. No pleural effusion. Musculoskeletal: No worrisome lytic or sclerotic osseous abnormality. Mild compression deformity noted at T5 and T7. CT ABDOMEN PELVIS FINDINGS Hepatobiliary: Subtle  nodularity of liver contour evident. 5.4 x 4.8 cm irregular heterogeneously enhancing mass again identified posterior right liver. Outside MRI from [place] dated 06/06/2021 is available. Measuring this lesion at a similar level and in the same dimensions, the mass was 4.5 x 4.2 cm on the previous study. Ill-defined margins in irregular shape make reproducible measurement difficult. Mild intrahepatic biliary duct dilatation again noted. Gallbladder is nondistended. Pancreas: No focal mass lesion. No dilatation of the main duct. No intraparenchymal cyst. No peripancreatic edema. Spleen: Spleen is enlarged at 14.5 cm craniocaudal length. Adrenals/Urinary Tract: No adrenal nodule or mass. Kidneys unremarkable No evidence for hydroureter. The urinary bladder appears normal for the degree of distention. Stomach/Bowel: Stomach is moderately distended with food and contrast material. Duodenum is normally positioned as is the ligament of Treitz. No small bowel wall thickening. No small bowel dilatation. Marked wall thickening is seen in the cecum, involving the ileocecal valve and extending distal to the level of the ileocecal valve. Colonic wall is ill-defined in there is a finger-like projection of soft tissue extending into the pericolonic fat (image 86/2) suggesting direct transmural tumor extension. No evidence for colonic obstruction. Vascular/Lymphatic: There is moderate atherosclerotic calcification of the abdominal aorta without aneurysm. IVC filter visualized in situ ill-defined low-density soft tissue in the hepatoduodenal ligament/porta hepatis is probably necrotic lymphadenopathy. Chronic portal vein occlusion evident with recanalization of the paraumbilical vein and cavernous transformation in the porta hepatis. There is extensive venous collateralization in the upper abdomen. No retroperitoneal lymphadenopathy. No pelvic sidewall lymphadenopathy. Reproductive: The uterus is unremarkable.  There is no adnexal  mass. Other: Trace free fluid noted in the cul-de-sac. Musculoskeletal: No worrisome lytic or sclerotic osseous abnormality. Sclerotic lesion in the sacrum likely a bone island. IMPRESSION: 1. Large right colonic mass involving the cecum, ileocecal valve, and ascending colon distal to the ileocecal valve. There is a finger-like projection of soft tissue into the medial  Peri cecal fat suggesting transmural tumor spread. 2. Ill-defined soft tissue in the porta hepatis likely related to necrotic metastatic lymphadenopathy. 3. Heterogeneous ill-defined mass in the posterior right liver consistent with metastatic disease. This measures slightly larger than on outside MRI of 07/07/2021. 4. 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 5. Scattered tiny pulmonary nodules. Close attention on follow-up recommended to exclude metastatic disease. 6. Trace free fluid in the pelvis. Electronically Signed   By: Misty Stanley M.D.   On: 09/25/2021 15:18    Assessment and Plan: Focal seizures ([place])  Brain metastases ([place])  We reviewed treatment options for new left frontal brain metastasis.  This is not amenable to surgery due to location.  Radiosurgery could be provided, but may need fractionation.  They understand the risks and benefits of brain radiation.  Patient is still in 'goals of care' review process with family, and is unsure if she wants to pursue further chemo, radiation at this time.  She is meeting with palliative care next week to continue the discussion.  Follow Up Instructions: RTC as needed following GOC eval  I discussed the assessment and treatment plan with the patient.  The patient was provided an opportunity to ask questions and all were answered.  The patient agreed with the plan and demonstrated understanding of the instructions.    The patient was  advised to call back or seek an in-person evaluation if the symptoms worsen or if the condition fails to improve as anticipated.  I provided 5-10 minutes of non-face-to-face time during this enocunter.  Ventura Sellers, MD   I provided 22 minutes of non face-to-face telephone visit time during this encounter, and > 50% was spent counseling as documented under my assessment & plan.

## 2021-10-07 ENCOUNTER — Telehealth: Payer: Medicare Other | Admitting: Hospice and Palliative Medicine

## 2021-10-08 ENCOUNTER — Inpatient Hospital Stay (HOSPITAL_BASED_OUTPATIENT_CLINIC_OR_DEPARTMENT_OTHER): Payer: Medicare Other | Admitting: Hospice and Palliative Medicine

## 2021-10-08 ENCOUNTER — Other Ambulatory Visit: Payer: Self-pay

## 2021-10-08 DIAGNOSIS — Z515 Encounter for palliative care: Secondary | ICD-10-CM | POA: Diagnosis not present

## 2021-10-08 DIAGNOSIS — C7931 Secondary malignant neoplasm of brain: Secondary | ICD-10-CM

## 2021-10-08 NOTE — Progress Notes (Signed)
Virtual Visit via Video Note  I connected with Cheryl Baldwin on 10/08/21 at 10:45 AM EST by a video enabled telemedicine application and verified that I am speaking with the correct person using two identifiers.  Location: Patient: Home Provider: Clinic   I discussed the limitations of evaluation and management by telemedicine and the availability of in person appointments. The patient expressed understanding and agreed to proceed.  History of Present Illness: Cheryl Baldwin is a 60 y.o. 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  Palliative care was consulted to help address goals and manage ongoing symptoms.     Observations/Objective: I spoke today with patient/sister.  Patient saw Dr. Tasia Catchings on 10/01/2021 with discussion for further chemotherapy.  Patient tells me today that she declined further cancer treatment and instead just wants to focus on staying home and being comfortable.  Today, patient reports she is doing reasonably well.  She denies any symptomatic concerns at present.  Sister does report that patient is becoming weaker, although she remains functionally independent.  They are interested in a walker and shower chair.  We will place orders.  We discussed option of hospice involvement.  Patient says that she is interested in hospice in the future but feels she is doing okay for now.  She was in agreement with palliative care following her in the home.  Assessment and Plan: Stage IV colorectal cancer -on best supportive care.  Would recommend hospice involvement when patient agreeable.  We will refer to home palliative care.  Weakness -walker/shower chair     Durable Medical Equipment  (From admission, onward)           Start     Ordered   10/08/21 0000  For home use only DME 4 wheeled  rolling walker with seat       Question:  Patient needs a walker to treat with the following condition  Answer:  Weakness   10/08/21 1124           (Discussed with [place])  Follow Up Instructions: Follow-up telephone visit 1 month   I discussed the assessment and treatment plan with the patient. The patient was provided an opportunity to ask questions and all were answered. The patient agreed with the plan and demonstrated an understanding of the instructions.   The patient was advised to call back or seek an in-person evaluation if the symptoms worsen or if the condition fails to improve as anticipated.  I provided 10 minutes of non-face-to-face time during this encounter.   [name], NP

## 2021-10-14 ENCOUNTER — Other Ambulatory Visit: Payer: Self-pay | Admitting: Hospice and Palliative Medicine

## 2021-10-14 DIAGNOSIS — R531 Weakness: Secondary | ICD-10-CM

## 2021-10-14 NOTE — Progress Notes (Signed)
DME: [facility]  Spoke with Bethanne Ginger with [facility]

## 2021-10-16 ENCOUNTER — Telehealth: Payer: Self-pay | Admitting: Primary Care

## 2021-10-16 NOTE — Telephone Encounter (Signed)
Attempted to contact patient's sister [name], to offer to schedule an In-home Palliative Consult, no answer and unable to leave a voicemail due to not set up.

## 2021-10-21 ENCOUNTER — Encounter: Payer: Self-pay | Admitting: Hospice and Palliative Medicine

## 2021-10-21 ENCOUNTER — Telehealth: Payer: Medicare Other | Admitting: Hospice and Palliative Medicine

## 2021-10-21 DIAGNOSIS — C7931 Secondary malignant neoplasm of brain: Secondary | ICD-10-CM

## 2021-10-21 NOTE — Telephone Encounter (Signed)
I spoke with patient's sister.  She reports that overall patient is declining.  She has talked with patient and they are in agreement with hospice involvement.  Referral for hospice was called to [facility].

## 2021-11-13 ENCOUNTER — Encounter: Payer: Self-pay | Admitting: Gastroenterology

## 2021-11-13 NOTE — Progress Notes (Signed)
After patient's last visit, Dr. Tasia Catchings confirmed pt does not need colonoscopy from their standpoint

## 2021-11-18 ENCOUNTER — Inpatient Hospital Stay: Payer: Medicare Other | Attending: Hospice and Palliative Medicine | Admitting: Hospice and Palliative Medicine

## 2021-11-18 DIAGNOSIS — Z515 Encounter for palliative care: Secondary | ICD-10-CM

## 2021-11-18 NOTE — Progress Notes (Signed)
Patient is actively being followed by hospice.  Spoke briefly with sister.  Patient is declining and had a fall this morning but no evidence of trauma/injury.  No acute needs at present.  Continue with hospice/comfort care.

## 2022-01-08 ENCOUNTER — Telehealth: Payer: Self-pay | Admitting: Hospice and Palliative Medicine

## 2022-01-08 NOTE — Telephone Encounter (Signed)
I received a call from hospice nurse.  Patient has a headache.  They requested okay to refill tramadol and start patient on dexamethasone.  Orders given.

## 2022-02-12 ENCOUNTER — Telehealth: Payer: Self-pay | Admitting: Hospice and Palliative Medicine

## 2022-02-12 NOTE — Telephone Encounter (Signed)
Received a call from hospice nurse, Lorriane Shire.  Patient has been declining with poor performance status and minimal oral intake.  She is starting to have worsening bilateral lower extremity edema.  Family asked about medication to help with edema.  We discussed trial of low-dose furosemide.  However, I suspect that edema is likely attributed to hypoalbuminemia and therefore would not be responsive to diuretics.  Additionally, diuretics can increase fall risk in this population.  Nurse plans to speak with patient/family about goals. ?

## 2022-02-20 ENCOUNTER — Other Ambulatory Visit: Payer: Self-pay | Admitting: Internal Medicine

## 2022-03-05 ENCOUNTER — Other Ambulatory Visit: Payer: Self-pay | Admitting: Hospice and Palliative Medicine

## 2022-03-05 MED ORDER — MORPHINE SULFATE (CONCENTRATE) 10 MG /0.5 ML PO SOLN: 5.0000 mg | ORAL | 0 refills | Status: AC | PRN

## 2022-03-05 MED ORDER — LORAZEPAM 0.5 MG PO TABS: 0.5000 mg | ORAL_TABLET | Freq: Three times a day (TID) | ORAL | 0 refills | Status: AC

## 2022-03-05 NOTE — Progress Notes (Signed)
Received a call from hospice nurse, Lorriane Shire.  Patient is declining and hospice is requesting morphine and lorazepam for comfort.  We will send Rxs to pharmacy ?

## 2022-03-09 ENCOUNTER — Other Ambulatory Visit: Payer: Self-pay | Admitting: *Deleted

## 2022-03-09 MED ORDER — GABAPENTIN 100 MG PO CAPS
200.0000 mg | ORAL_CAPSULE | ORAL | 1 refills | Status: AC
Start: 2022-03-09 — End: ?

## 2022-03-16 DEATH — deceased
